# Patient Record
Sex: Male | Born: 1985 | Race: Black or African American | Hispanic: No | Marital: Married | Smoking: Current some day smoker
Health system: Southern US, Community
[De-identification: ages and names within clinical notes are randomized; demographics above are authoritative.]

## PROBLEM LIST (undated history)

## (undated) DIAGNOSIS — G4733 Obstructive sleep apnea (adult) (pediatric): Secondary | ICD-10-CM

## (undated) DIAGNOSIS — I10 Essential (primary) hypertension: Secondary | ICD-10-CM

## (undated) DIAGNOSIS — R7303 Prediabetes: Secondary | ICD-10-CM

## (undated) DIAGNOSIS — M545 Low back pain, unspecified: Secondary | ICD-10-CM

## (undated) DIAGNOSIS — R12 Heartburn: Secondary | ICD-10-CM

## (undated) HISTORY — DX: Low back pain, unspecified: M54.50

## (undated) HISTORY — DX: Heartburn: R12

## (undated) HISTORY — DX: Essential (primary) hypertension: I10

## (undated) HISTORY — DX: Prediabetes: R73.03

## (undated) HISTORY — DX: Morbid (severe) obesity due to excess calories: E66.01

## (undated) HISTORY — DX: Obstructive sleep apnea (adult) (pediatric): G47.33

## (undated) HISTORY — PX: HERNIA REPAIR: SHX51

---
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
## 2017-02-03 DIAGNOSIS — Z23 Encounter for immunization: Secondary | ICD-10-CM | POA: Diagnosis not present

## 2018-02-11 ENCOUNTER — Encounter (INDEPENDENT_AMBULATORY_CARE_PROVIDER_SITE_OTHER): Payer: BLUE CROSS/BLUE SHIELD

## 2018-02-22 ENCOUNTER — Encounter (INDEPENDENT_AMBULATORY_CARE_PROVIDER_SITE_OTHER): Payer: Self-pay

## 2018-02-22 ENCOUNTER — Ambulatory Visit (INDEPENDENT_AMBULATORY_CARE_PROVIDER_SITE_OTHER): Payer: Self-pay | Admitting: Family Medicine

## 2018-03-08 ENCOUNTER — Ambulatory Visit (INDEPENDENT_AMBULATORY_CARE_PROVIDER_SITE_OTHER): Payer: Self-pay | Admitting: Family Medicine

## 2018-06-29 ENCOUNTER — Encounter: Payer: Self-pay | Admitting: Family Medicine

## 2018-07-01 ENCOUNTER — Encounter: Payer: Self-pay | Admitting: Family Medicine

## 2018-07-01 ENCOUNTER — Other Ambulatory Visit: Payer: Self-pay

## 2018-07-01 ENCOUNTER — Ambulatory Visit (INDEPENDENT_AMBULATORY_CARE_PROVIDER_SITE_OTHER): Payer: BLUE CROSS/BLUE SHIELD | Admitting: Family Medicine

## 2018-07-01 ENCOUNTER — Ambulatory Visit (INDEPENDENT_AMBULATORY_CARE_PROVIDER_SITE_OTHER): Payer: BLUE CROSS/BLUE SHIELD

## 2018-07-01 VITALS — BP 120/92 | HR 108 | Temp 98.2°F | Resp 17 | Ht 70.0 in | Wt 367.6 lb

## 2018-07-01 DIAGNOSIS — R202 Paresthesia of skin: Secondary | ICD-10-CM | POA: Diagnosis not present

## 2018-07-01 DIAGNOSIS — E669 Obesity, unspecified: Secondary | ICD-10-CM | POA: Diagnosis not present

## 2018-07-01 DIAGNOSIS — Z6841 Body Mass Index (BMI) 40.0 and over, adult: Secondary | ICD-10-CM | POA: Diagnosis not present

## 2018-07-01 DIAGNOSIS — M5416 Radiculopathy, lumbar region: Secondary | ICD-10-CM

## 2018-07-01 DIAGNOSIS — M5432 Sciatica, left side: Secondary | ICD-10-CM

## 2018-07-01 DIAGNOSIS — M545 Low back pain: Secondary | ICD-10-CM | POA: Diagnosis not present

## 2018-07-01 DIAGNOSIS — M5430 Sciatica, unspecified side: Secondary | ICD-10-CM

## 2018-07-01 DIAGNOSIS — Z7689 Persons encountering health services in other specified circumstances: Secondary | ICD-10-CM

## 2018-07-01 MED ORDER — CYCLOBENZAPRINE HCL 5 MG PO TABS: 5.0000 mg | ORAL_TABLET | Freq: Two times a day (BID) | ORAL | 1 refills | Status: DC | PRN

## 2018-07-01 MED ORDER — PREDNISONE 20 MG PO TABS: ORAL_TABLET | ORAL | 0 refills | Status: DC

## 2018-07-01 NOTE — Patient Instructions (Addendum)
Thank you for choosing Primary Care at [facility] to be your medical home!    Jacob Roberts was seen by [name], FNP today.   Desmond Lope primary care provider is Bing Neighbors, FNP.   For the best care possible, you should try to see [name], FNP-C whenever you come to the clinic.   We look forward to seeing you again soon!  If you have any questions about your visit today, please call us at [phone] or feel free to reach your primary care provider via MyChart.    Acute Back Pain, Adult Acute back pain is sudden and usually short-lived. It is often caused by an injury to the muscles and tissues in the back. The injury may result from:  A muscle or ligament getting overstretched or torn (strained). Ligaments are tissues that connect bones to each other. Lifting something improperly can cause a back strain.  Wear and tear (degeneration) of the spinal disks. Spinal disks are circular tissue that provides cushioning between the bones of the spine (vertebrae).  Twisting motions, such as while playing sports or doing yard work.  A hit to the back.  Arthritis. You may have a physical exam, lab tests, and imaging tests to find the cause of your pain. Acute back pain usually goes away with rest and home care. Follow these instructions at home: Managing pain, stiffness, and swelling  Take over-the-counter and prescription medicines only as told by your health care provider.  Your health care provider may recommend applying ice during the first 24-48 hours after your pain starts. To do this: ? Put ice in a plastic bag. ? Place a towel between your skin and the bag. ? Leave the ice on for 20 minutes, 2-3 times a day.  If directed, apply heat to the affected area as often as told by your health care provider. Use the heat source that your health care provider recommends, such as a moist heat pack or a heating pad. ? Place a towel between your skin and the  heat source. ? Leave the heat on for 20-30 minutes. ? Remove the heat if your skin turns bright red. This is especially important if you are unable to feel pain, heat, or cold. You have a greater risk of getting burned. Activity   Do not stay in bed. Staying in bed for more than 1-2 days can delay your recovery.  Sit up and stand up straight. Avoid leaning forward when you sit, or hunching over when you stand. ? If you work at a desk, sit close to it so you do not need to lean over. Keep your chin tucked in. Keep your neck drawn back, and keep your elbows bent at a right angle. Your arms should look like the letter "L." ? Sit high and close to the steering wheel when you drive. Add lower back (lumbar) support to your car seat, if needed.  Take short walks on even surfaces as soon as you are able. Try to increase the length of time you walk each day.  Do not sit, drive, or stand in one place for more than 30 minutes at a time. Sitting or standing for long periods of time can put stress on your back.  Do not drive or use heavy machinery while taking prescription pain medicine.  Use proper lifting techniques. When you bend and lift, use positions that put less stress on your back: ? [place] your knees. ? Keep the load close to  your body. ? Avoid twisting.  Exercise regularly as told by your health care provider. Exercising helps your back heal faster and helps prevent back injuries by keeping muscles strong and flexible.  Work with a physical therapist to make a safe exercise program, as recommended by your health care provider. Do any exercises as told by your physical therapist. Lifestyle  Maintain a healthy weight. Extra weight puts stress on your back and makes it difficult to have good posture.  Avoid activities or situations that make you feel anxious or stressed. Stress and anxiety increase muscle tension and can make back pain worse. Learn ways to manage anxiety and stress, such as  through exercise. General instructions  Sleep on a firm mattress in a comfortable position. Try lying on your side with your knees slightly bent. If you lie on your back, put a pillow under your knees.  Follow your treatment plan as told by your health care provider. This may include: ? Cognitive or behavioral therapy. ? Acupuncture or massage therapy. ? Meditation or yoga. Contact a health care provider if:  You have pain that is not relieved with rest or medicine.  You have increasing pain going down into your legs or buttocks.  Your pain does not improve after 2 weeks.  You have pain at night.  You lose weight without trying.  You have a fever or chills. Get help right away if:  You develop new bowel or bladder control problems.  You have unusual weakness or numbness in your arms or legs.  You develop nausea or vomiting.  You develop abdominal pain.  You feel faint. Summary  Acute back pain is sudden and usually short-lived.  Use proper lifting techniques. When you bend and lift, use positions that put less stress on your back.  Take over-the-counter and prescription medicines and apply heat or ice as directed by your health care provider. This information is not intended to replace advice given to you by your health care provider. Make sure you discuss any questions you have with your health care provider. Document Released: 04/07/2005 Document Revised: 11/12/2017 Document Reviewed: 11/19/2016 Elsevier Interactive Patient Education  2019 [employer] Inc[employer]    Radicular Pain Radicular pain is a type of pain that spreads from your back or neck along a spinal nerve. Spinal nerves are nerves that leave the spinal cord and go to the muscles. Radicular pain is sometimes called radiculopathy, radiculitis, or a pinched nerve. When you have this type of pain, you may also have weakness, numbness, or tingling in the area of your body that is supplied by the nerve. The pain may  feel sharp and burning. Depending on which spinal nerve is affected, the pain may occur in the:  Neck area (cervical radicular pain). You may also feel pain, numbness, weakness, or tingling in the arms.  Mid-spine area (thoracic radicular pain). You would feel this pain in the back and chest. This type is rare.  Lower back area (lumbar radicular pain). You would feel this pain as low back pain. You may feel pain, numbness, weakness, or tingling in the buttocks or legs. Sciatica is a type of lumbar radicular pain that shoots down the back of the leg. Radicular pain occurs when one of the spinal nerves becomes irritated or squeezed (compressed). It is often caused by something pushing on a spinal nerve, such as one of the bones of the spine (vertebrae) or one of the round cushions between vertebrae (intervertebral disks). This can result from:  An injury.  Wear and tear or aging of a disk.  The growth of a bone spur that pushes on the nerve. Radicular pain often goes away when you follow instructions from your health care provider for relieving pain at home. Follow these instructions at home: Managing pain      If directed, put ice on the affected area: ? Put ice in a plastic bag. ? Place a towel between your skin and the bag. ? Leave the ice on for 20 minutes, 2-3 times a day.  If directed, apply heat to the affected area as often as told by your health care provider. Use the heat source that your health care provider recommends, such as a moist heat pack or a heating pad. ? Place a towel between your skin and the heat source. ? Leave the heat on for 20-30 minutes. ? Remove the heat if your skin turns bright red. This is especially important if you are unable to feel pain, heat, or cold. You may have a greater risk of getting burned. Activity   Do not sit or rest in bed for long periods of time.  Try to stay as active as possible. Ask your health care provider what type of exercise  or activity is best for you.  Avoid activities that make your pain worse, such as bending and lifting.  Do not lift anything that is heavier than 10 lb (4.5 kg), or the limit that you are told, until your health care provider says that it is safe.  Practice using proper technique when lifting items. Proper lifting technique involves bending your knees and rising up.  Do strength and range-of-motion exercises only as told by your health care provider or physical therapist. General instructions  Take over-the-counter and prescription medicines only as told by your health care provider.  Pay attention to any changes in your symptoms.  Keep all follow-up visits as told by your health care provider. This is important. ? Your health care provider may send you to a physical therapist to help with this pain. Contact a health care provider if:  Your pain and other symptoms get worse.  Your pain medicine is not helping.  Your pain has not improved after a few weeks of home care.  You have a fever. Get help right away if:  You have severe pain, weakness, or numbness.  You have difficulty with bladder or bowel control. Summary  Radicular pain is a type of pain that spreads from your back or neck along a spinal nerve.  When you have radicular pain, you may also have weakness, numbness, or tingling in the area of your body that is supplied by the nerve.  The pain may feel sharp or burning.  Radicular pain may be treated with ice, heat, medicines, or physical therapy. This information is not intended to replace advice given to you by your health care provider. Make sure you discuss any questions you have with your health care provider. Document Released: 05/15/2004 Document Revised: 10/20/2017 Document Reviewed: 10/20/2017 Elsevier Interactive Patient Education  2019 [employer].

## 2018-07-01 NOTE — Progress Notes (Signed)
Jacob Roberts, is a 33 y.o. male  VVZ:[id number]  [id number]:[id number]  DOB - 08/24/1985  CC:  Chief Complaint  Patient presents with  . Establish Care  . Back Pain    hurt his back shoveling snow 2 years ago. has c/o lower back pain that radiates down L leg       HPI: Jacob Roberts is a 33 y.o. male is here today to establish care and evaluation of back pain.  Back Pain Patient presents with a complaint of back pain which is radiating into his buttocks down to his level of his thighs. He reports worsening of leg pain on the left side.  Initially began 2 years ago as he feels that he injured his back shoveling snow.  He has been managing pain with over-the-counter NSAIDs he has not had previous imaging or evaluation for this problem.  He denies any problems with urination, defecation, or numbness or tingling of lower extremities. Current Body mass index is 52.75 kg/m.  Pertinent family medical history: family history includes Diabetes in his brother; Hypertension in his father, maternal grandmother, and mother.   Allergies  Allergen Reactions  . Penicillins Hives  . Sulfa Antibiotics Shortness Of Breath    Social History   Socioeconomic History  . Marital status: Married    Spouse name: Not on file  . Number of children: 2  . Years of education: Not on file  . Highest education level: Not on file  Occupational History  . Not on file  Social Needs  . Financial resource strain: Not on file  . Food insecurity:    Worry: Not on file    Inability: Not on file  . Transportation needs:    Medical: Not on file    Non-medical: Not on file  Tobacco Use  . Smoking status: Current Every Day Smoker  . Smokeless tobacco: Never Used  Substance and Sexual Activity  . Alcohol use: Yes  . Drug use: Not on file  . Sexual activity: Yes    Partners: Female  Lifestyle  . Physical activity:    Days per week: Not on file    Minutes per session: Not on file  . Stress: Not on file   Relationships  . Social connections:    Talks on phone: Not on file    Gets together: Not on file    Attends religious service: Not on file    Active member of club or organization: Not on file    Attends meetings of clubs or organizations: Not on file    Relationship status: Not on file  . Intimate partner violence:    Fear of current or ex partner: Not on file    Emotionally abused: Not on file    Physically abused: Not on file    Forced sexual activity: Not on file  Other Topics Concern  . Not on file  Social History Narrative  . Not on file   Review of Systems: Pertinent negatives listed in HPI Objective:   Vitals:   07/01/18 1517  BP: (!) 120/92  Pulse: (!) 108  Resp: 17  Temp: 98.2 F (36.8 C)  SpO2: 95%    BP Readings from Last 3 Encounters:  07/01/18 (!) 120/92    Filed Weights   07/01/18 1517  Weight: (!) 367 lb 9.6 oz (166.7 kg)      Physical Exam: Constitutional: Patient appears well-developed and well-nourished. No distress. HENT: Normocephalic, atraumatic, External right and left ear normal.  Eyes: Conjunctivae  and EOM are normal. PERRLA, no scleral icterus. Neck: Normal ROM. Neck supple. No JVD. No tracheal deviation. No thyromegaly. CVS: RRR, S1/S2 +, no murmurs, no gallops, no carotid bruit.  Pulmonary: Effort and breath sounds normal, no stridor, rhonchi, wheezes, rales.  Abdominal: Soft. BS +, no distension, tenderness, rebound or guarding.  Musculoskeletal: Positive bilateral SLR  Neuro: Alert. Normal muscle tone coordination. Normal gait. BUE and BLE strength 5/5.  Skin: Skin is warm and dry. No rash noted. Not diaphoretic. No erythema. No pallor. Psychiatric: Normal mood and affect. Behavior, judgment, thought content normal.     Assessment and plan:  1. Encounter to establish care 2. Sciatica, unspecified laterality 3. Lumbar radiculopathy Imaging performed was negative of any chronic or acute changes. Will trial prednisone taper and  cyclobenzaprine twice daily as needed for pain. If no improvement, will refer to orthopedic speciality for further evaluation.  Dg Lumbar Spine Complete Result Date: 07/01/2018 CLINICAL DATA:  Chronic l spine pain x 2 years. Pain centered around left aspect oaf spine around L4-L5, with persistent dull pain. Depending on how he lays left leg can go numb. Thinks from shoveling snow in 2018. EXAM: LUMBAR SPINE - COMPLETE 4+ VIEW COMPARISON:  None. FINDINGS: Transitional thoracolumbar vertebra designated T12. No fracture, bone lesion or spondylolisthesis. Discs are well maintained in height. Facet joints are well maintained. Soft tissues are unremarkable. IMPRESSION: Negative. Electronically Signed   By: Amie Portland M.D.   On: 07/01/2018 15:54   Meds ordered this encounter  Medications  . predniSONE (DELTASONE) 20 MG tablet    Sig: Take 3 PO QAM x3days, 2 PO QAM x3days, 1 PO QAM x3days    Dispense:  18 tablet    Refill:  0  . cyclobenzaprine (FLEXERIL) 5 MG tablet    Sig: Take 1 tablet (5 mg total) by mouth 2 (two) times daily as needed for muscle spasms.    Dispense:  30 tablet    Refill:  1     Return in about 4 weeks (around 07/29/2018).   A total of 30 minutes spent, greater than 50 % of this time was spent counseling, reviewing and interpreting diagnostic study,  and coordination of care.   The patient was given clear instructions to go to ER or return to medical center if symptoms don't improve, worsen or new problems develop. The patient verbalized understanding. The patient was advised  to call and obtain lab results if they haven't heard anything from out office within 7-10 business days.  [name], FNP Primary Care at [facility] [address] [place] [address] [phone]: [phone]      This note has been created with Dragon speech recognition software and Paediatric nurse. Any transcriptional errors are unintentional.

## 2018-07-04 DIAGNOSIS — M5416 Radiculopathy, lumbar region: Secondary | ICD-10-CM | POA: Insufficient documentation

## 2018-07-04 DIAGNOSIS — Z7689 Persons encountering health services in other specified circumstances: Secondary | ICD-10-CM | POA: Insufficient documentation

## 2018-08-03 ENCOUNTER — Telehealth: Payer: Self-pay

## 2018-08-03 NOTE — Telephone Encounter (Signed)
Called patient to do their pre-visit COVID screening.  Call went to voicemail. Unable to do screening.  

## 2018-08-04 ENCOUNTER — Other Ambulatory Visit: Payer: Self-pay

## 2018-08-04 ENCOUNTER — Encounter: Payer: Self-pay | Admitting: Family Medicine

## 2018-08-04 ENCOUNTER — Ambulatory Visit (INDEPENDENT_AMBULATORY_CARE_PROVIDER_SITE_OTHER): Payer: BLUE CROSS/BLUE SHIELD | Admitting: Family Medicine

## 2018-08-04 VITALS — BP 148/90 | HR 89 | Temp 98.0°F | Ht 70.0 in | Wt 377.6 lb

## 2018-08-04 DIAGNOSIS — M5431 Sciatica, right side: Secondary | ICD-10-CM | POA: Diagnosis not present

## 2018-08-04 DIAGNOSIS — E669 Obesity, unspecified: Secondary | ICD-10-CM | POA: Diagnosis not present

## 2018-08-04 DIAGNOSIS — M5432 Sciatica, left side: Secondary | ICD-10-CM | POA: Diagnosis not present

## 2018-08-04 DIAGNOSIS — Z6841 Body Mass Index (BMI) 40.0 and over, adult: Secondary | ICD-10-CM

## 2018-08-04 DIAGNOSIS — I10 Essential (primary) hypertension: Secondary | ICD-10-CM

## 2018-08-04 MED ORDER — KETOROLAC TROMETHAMINE 60 MG/2ML IM SOLN
60.0000 mg | Freq: Once | INTRAMUSCULAR | Status: AC
  Administered 2018-08-04: 60 mg via INTRAMUSCULAR

## 2018-08-04 MED ORDER — MELOXICAM 15 MG PO TABS: 15.0000 mg | ORAL_TABLET | Freq: Every day | ORAL | 2 refills | Status: DC

## 2018-08-04 NOTE — Progress Notes (Signed)
Patient ID: Jacob Roberts, male    DOB: 06/10/1985, 33 y.o.   MRN: [id number]  PCP: Bing Neighbors, FNP  Chief Complaint  Patient presents with  . Back Pain    Subjective:  HPI  Jacob Roberts is a 33 y.o. male presents for evaluation of chronic on-going back pain.  Medical problems include: Obesity, hypertension, and chronic back pain.   Jacob Roberts was seen in office on 07/01/18 for evaluation and management of on-going back pain with right sided sciatica. Prior to his visit, patient was taking multiple doses of ibuprofen and naproxen to manage pain.Prior to visit on 3/12 he had no prior imaging. Imaging performed on 3/12 was negative for degenerative or acute changes to explain his symptoms. He was placed on a prednisone taper and report immediate relief while taking prednisone. Pain returned immediately after completing taper. He has resumed daily naproxen for pain management and continues to take cyclobenzaprine at night to control pain. Pain continues to be situated to mid to lower right back and radiates down to right buttocks-hip area. He has started walking for activity however this worsens pain. He has recently joined a medical weight management program and starts program within one week. Current Body mass index is 54.18 kg/m. Social History   Socioeconomic History  . Marital status: Married    Spouse name: Not on file  . Number of children: 2  . Years of education: Not on file  . Highest education level: Not on file  Occupational History  . Not on file  Social Needs  . Financial resource strain: Not on file  . Food insecurity:    Worry: Not on file    Inability: Not on file  . Transportation needs:    Medical: Not on file    Non-medical: Not on file  Tobacco Use  . Smoking status: Current Every Day Smoker  . Smokeless tobacco: Never Used  Substance and Sexual Activity  . Alcohol use: Yes  . Drug use: Never  . Sexual activity: Yes    Partners: Female   Lifestyle  . Physical activity:    Days per week: Not on file    Minutes per session: Not on file  . Stress: Not on file  Relationships  . Social connections:    Talks on phone: Not on file    Gets together: Not on file    Attends religious service: Not on file    Active member of club or organization: Not on file    Attends meetings of clubs or organizations: Not on file    Relationship status: Not on file  . Intimate partner violence:    Fear of current or ex partner: Not on file    Emotionally abused: Not on file    Physically abused: Not on file    Forced sexual activity: Not on file  Other Topics Concern  . Not on file  Social History Narrative  . Not on file    Family History  Problem Relation Age of Onset  . Hypertension Maternal Grandmother   . Hypertension Mother   . Hypertension Father   . Diabetes Brother   . Stroke Neg Hx   . Hyperlipidemia Neg Hx   . Heart disease Neg Hx    Review of Systems  Pertinent negatives listed in HPI Patient Active Problem List   Diagnosis Date Noted  . Lumbar radiculopathy 07/04/2018  . Encounter to establish care 07/04/2018    Allergies  Allergen Reactions  . Penicillins  Hives  . Sulfa Antibiotics Shortness Of Breath    Prior to Admission medications   Medication Sig Start Date End Date Taking? Authorizing Provider  cyclobenzaprine (FLEXERIL) 5 MG tablet Take 1 tablet (5 mg total) by mouth 2 (two) times daily as needed for muscle spasms. 07/01/18  Yes Bing NeighborsHarris, Shandrea Lusk S, FNP  naproxen sodium (ALEVE) 220 MG tablet Take 220 mg by mouth daily.   Yes [provider]  meloxicam (MOBIC) 15 MG tablet Take 1 tablet (15 mg total) by mouth daily. 08/04/18   Bing NeighborsHarris, Keshia Weare S, FNP    Past Medical, Surgical Family and Social History reviewed and updated.    Objective:   Today's Vitals   08/04/18 1402 08/04/18 1406  BP: (!) 142/93 (!) 148/90  Pulse: 86 89  Temp: 98 F (36.7 C)   TempSrc: Oral   SpO2: 98%    Weight: (!) 377 lb 9.6 oz (171.3 kg)   Height: 5\' 10"  (1.778 m)   PainSc: 6  6   PainLoc: Back     Wt Readings from Last 3 Encounters:  08/04/18 (!) 377 lb 9.6 oz (171.3 kg)  07/01/18 (!) 367 lb 9.6 oz (166.7 kg)     Physical Exam General appearance: alert, well developed, well nourished, cooperative and in no distress Head: Normocephalic, without obvious abnormality, atraumatic Respiratory: Respirations even and unlabored, normal respiratory rate Heart: rate and rhythm normal. No gallop or murmurs noted on exam  Extremities: No gross deformities Skin: Skin color, texture, turgor normal. No rashes seen  Psych: Appropriate mood and affect. Neurologic: Mental status: Alert, oriented to person, place, and time, thought content appropriate.   Assessment & Plan:  1. Bilateral sciatica Given recent prednisone taper less than 1 month ago, I will defer prednisone for treatment. He agreed to receive a ketorolac injection today while in office.  Start Meloxicam as needed for antiinflammatory and pain management. Referral placed to Orthopedic Surgery  2. Essential hypertension -Advised patient BP elevated today. Hx of hypertension, however improved, medication was discontinued. Encouraged patient to monitor BP at home, recommend DASH diet, and increase physical activity    A total of 25 minutes spent, greater than 50 % of this time was spent counseling and coordination of care.  -The patient was given clear instructions to go to ER or return to medical center if symptoms do not improve, worsen or new problems develop. The patient verbalized understanding.    Jacob CourtsKimberly Stephone Gum, FNP [facility] [address], [place] [id number] [phone]: [phone]

## 2018-08-04 NOTE — Progress Notes (Deleted)
Back pain, when he stopped his steroid medication the pain came back. But when he was taking it, the pain was manageable.

## 2018-08-04 NOTE — Patient Instructions (Signed)

## 2018-08-13 ENCOUNTER — Ambulatory Visit (INDEPENDENT_AMBULATORY_CARE_PROVIDER_SITE_OTHER): Payer: BLUE CROSS/BLUE SHIELD | Admitting: Orthopaedic Surgery

## 2018-08-13 ENCOUNTER — Other Ambulatory Visit: Payer: Self-pay

## 2018-08-13 ENCOUNTER — Encounter (INDEPENDENT_AMBULATORY_CARE_PROVIDER_SITE_OTHER): Payer: Self-pay | Admitting: Orthopaedic Surgery

## 2018-08-13 VITALS — Ht 70.0 in | Wt 375.0 lb

## 2018-08-13 DIAGNOSIS — Z6841 Body Mass Index (BMI) 40.0 and over, adult: Secondary | ICD-10-CM

## 2018-08-13 DIAGNOSIS — M545 Low back pain, unspecified: Secondary | ICD-10-CM

## 2018-08-13 DIAGNOSIS — G8929 Other chronic pain: Secondary | ICD-10-CM

## 2018-08-13 NOTE — Progress Notes (Signed)
Office Visit Note/orthopedic consultation   Patient: Jacob Roberts           Date of Birth: 22-Jun-1985           MRN: [id number] Visit Date: 08/13/2018              Requested by: Bing NeighborsHarris, Kimberly S, FNP [address] [place], [place] [address] PCP: Bing NeighborsHarris, Kimberly S, FNP   Assessment & Plan: Visit Diagnoses:  1. Chronic left-sided low back pain, unspecified whether sciatica present   2. Body mass index 50.0-59.9, adult (HCC)     Plan: The patient meets the AMA guidelines for Morbid (severe) obesity with a BMI > 40.0 and I have recommended weight loss. We will proceed with a lumbar MRI will have to use an open scanner due to his body habitus we discussed with him that if there was anything surgically indicated his BMI would need to be less than 40 which would put his target weight at about 275 and has about 100 pounds he needs to lose.  He may have something that could respond to an epidural injection.  He will continue with the medical weight loss management program we discussed using exercise bike other activities that should not bother his back.  Office follow-up after MRI scan for review.  Thanks the opportunity see him in consultation.  Follow-Up Instructions: Return after MRI.  Weigh patient on return.  Orders:  Orders Placed This Encounter  Procedures   MR Lumbar Spine w/o contrast   No orders of the defined types were placed in this encounter.     Procedures: No procedures performed   Clinical Data: No additional findings.   Subjective: Chief Complaint  Patient presents with   Lower Back - Pain    HPI patient was sent by Joaquin CourtsKimberly Harris, FNP for consultation.  Patient is a 33 year old male with chronic low back pain which is been present for 2 years since he was shoveling the driveway.  Has pain at the lumbosacral junction he was given prednisone pack got good relief temporarily and then had recurrence of pain.  He took some Aleve but states the  meloxicam he is currently on is worked better he is used some Flexeril but when he takes it during the day makes him a little bit sleepy.  He has pain that radiates more on the left lower back and left posterior buttocks left thigh with numbness in his thigh does not radiate past his knee.  Patient is continued to work.  Denies bowel bladder symptoms no fever chills.  No history of rheumatologic disease.  BMI elevated at 53.  Few weeks ago he was having more pain on the right than left side again in his buttocks radiating to his thigh.  Walking worsens his pain.  Recent joining a medical weight loss management program andHe is lost a few pounds.  Review of Systems positive for morbid obesity chronic low back pain.   Objective: Vital Signs: Ht 5\' 10"  (1.778 m)    Wt (!) 375 lb (170.1 kg)    BMI 53.81 kg/m   Physical Exam Constitutional:      Appearance: He is well-developed.  HENT:     Head: Normocephalic and atraumatic.  Eyes:     Pupils: Pupils are equal, round, and reactive to light.  Neck:     Thyroid: No thyromegaly.     Trachea: No tracheal deviation.  Cardiovascular:     Rate and Rhythm: Normal rate.  Pulmonary:     Effort: Pulmonary effort is normal.     Breath sounds: No wheezing.  Abdominal:     General: Bowel sounds are normal.     Palpations: Abdomen is soft.  Skin:    General: Skin is warm and dry.     Capillary Refill: Capillary refill takes less than 2 seconds.  Neurological:     Mental Status: He is alert and oriented to person, place, and time.  Psychiatric:        Behavior: Behavior normal.        Thought Content: Thought content normal.        Judgment: Judgment normal.     Ortho Exam patient is able heel and toe walk knee and ankle jerk are intact negative logroll of the hips.  Mild discomfort straight leg raising right and left at 90 degrees.  Mild sciatic notch tenderness some pain with palpation lumbosacral junction.  No rash over exposed skin.  Anterior  tib gastrocsoleus is strong he can easily heel and toe walk.  No atrophy to either right or left lower extremity.normal cervical ROM. Specialty Comments:  No specialty comments available.  Imaging: CLINICAL DATA:  Chronic l spine pain x 2 years. Pain centered around left aspect oaf spine around L4-L5, with persistent dull pain. Depending on how he lays left leg can go numb. Thinks from shoveling snow in 2018.  EXAM: LUMBAR SPINE - COMPLETE 4+ VIEW  COMPARISON:  None.  FINDINGS: Transitional thoracolumbar vertebra designated T12.  No fracture, bone lesion or spondylolisthesis.  Discs are well maintained in height. Facet joints are well maintained.  Soft tissues are unremarkable.  IMPRESSION: Negative.   Electronically Signed   By: Amie Portland M.D.   On: 07/01/2018 15:54   Images and results reviewed with pt today.  PMFS History: Patient Active Problem List   Diagnosis Date Noted   Lumbar radiculopathy 07/04/2018   Encounter to establish care 07/04/2018   Past Medical History:  Diagnosis Date   Essential hypertension    Morbid obesity (HCC)    Obstructive sleep apnea     Family History  Problem Relation Age of Onset   Hypertension Maternal Grandmother    Hypertension Mother    Hypertension Father    Diabetes Brother    Stroke Neg Hx    Hyperlipidemia Neg Hx    Heart disease Neg Hx     Past Surgical History:  Procedure Laterality Date   HERNIA REPAIR     Social History   Occupational History   Not on file  Tobacco Use   Smoking status: Current Every Day Smoker   Smokeless tobacco: Never Used  Substance and Sexual Activity   Alcohol use: Yes   Drug use: Never   Sexual activity: Yes    Partners: Female

## 2018-08-15 ENCOUNTER — Encounter (INDEPENDENT_AMBULATORY_CARE_PROVIDER_SITE_OTHER): Payer: Self-pay | Admitting: Orthopaedic Surgery

## 2018-08-15 DIAGNOSIS — Z6841 Body Mass Index (BMI) 40.0 and over, adult: Secondary | ICD-10-CM | POA: Insufficient documentation

## 2018-08-16 ENCOUNTER — Other Ambulatory Visit: Payer: Self-pay | Admitting: Family Medicine

## 2018-08-30 ENCOUNTER — Ambulatory Visit
Admission: RE | Admit: 2018-08-30 | Discharge: 2018-08-30 | Disposition: A | Discharge status: Home / Self Care | Payer: BLUE CROSS/BLUE SHIELD | Source: Ambulatory Visit | Attending: Orthopaedic Surgery | Admitting: Orthopaedic Surgery

## 2018-08-30 ENCOUNTER — Other Ambulatory Visit: Payer: Self-pay

## 2018-08-30 DIAGNOSIS — M48061 Spinal stenosis, lumbar region without neurogenic claudication: Secondary | ICD-10-CM | POA: Diagnosis not present

## 2018-08-30 DIAGNOSIS — M545 Low back pain, unspecified: Secondary | ICD-10-CM

## 2018-08-30 DIAGNOSIS — G8929 Other chronic pain: Secondary | ICD-10-CM

## 2018-09-03 ENCOUNTER — Ambulatory Visit: Payer: BLUE CROSS/BLUE SHIELD | Admitting: Orthopaedic Surgery

## 2018-09-03 ENCOUNTER — Encounter: Payer: Self-pay | Admitting: Orthopaedic Surgery

## 2018-09-03 ENCOUNTER — Other Ambulatory Visit: Payer: Self-pay

## 2018-09-03 VITALS — Ht 70.0 in | Wt 375.0 lb

## 2018-09-03 DIAGNOSIS — Z6841 Body Mass Index (BMI) 40.0 and over, adult: Secondary | ICD-10-CM | POA: Diagnosis not present

## 2018-09-03 DIAGNOSIS — M5416 Radiculopathy, lumbar region: Secondary | ICD-10-CM | POA: Diagnosis not present

## 2018-09-03 DIAGNOSIS — M47816 Spondylosis without myelopathy or radiculopathy, lumbar region: Secondary | ICD-10-CM | POA: Diagnosis not present

## 2018-09-03 NOTE — Progress Notes (Signed)
Office Visit Note   Patient: Jacob Roberts           Date of Birth: 12-Oct-1985           MRN: [id number] Visit Date: 09/03/2018              Requested by: Bing NeighborsHarris, Kimberly S, FNP [address] [place], [place] [address] PCP: Bing NeighborsHarris, Kimberly S, FNP   Assessment & Plan: Visit Diagnoses:  1. Lumbar radiculopathy   2. Body mass index 50.0-59.9, adult (HCC)     Plan: His MRI scan shows some facet arthropathy moderate degree at L4-5 on the left.  We will set him up for weight loss clinic with Dr. Dalbert GarnetBeasley patient's request his wife is already a patient there and is happy with her progress.  We will send him for some physical therapy.  Recheck 1 month.  Therapy is not helpful we can consider a single L4-5 facet injection although he has gotten some improvement with the meloxicam.We discussed improvement in his symptoms with weight loss which will help unload the facets.  He does not have any significant compression requiring operative intervention at this time.  Follow-Up Instructions: No follow-ups on file.   Orders:  No orders of the defined types were placed in this encounter.  No orders of the defined types were placed in this encounter.     Procedures: No procedures performed   Clinical Data: No additional findings.   Subjective: Chief Complaint  Patient presents with  . Lower Back - Pain    MRI Lumbar Review    HPI 33 year old male returns with ongoing problems with chronic low back pain.  Onset a few years ago when he shelving the driveway.  He has been treated conservatively prednisone, anti-inflammatories, meloxicam, Flexeril.  MRI scan has been obtained and is available for review.  Has primarily left side symptoms.  No bowel bladder associated symptoms.  BMI is greater than 50 he is been working on weight loss has not been very successful during coronavirus quarantine with limited mobility.  Patient has noted some improvement with meloxicam.  Review of  Systems update unchanged from 08/12/2012 point systems updated.   Objective: Vital Signs: Ht 5\' 10"  (1.778 m)   Wt (!) 375 lb (170.1 kg)   BMI 53.81 kg/m   Physical Exam Constitutional:      Appearance: He is well-developed.  HENT:     Head: Normocephalic and atraumatic.  Eyes:     Pupils: Pupils are equal, round, and reactive to light.  Neck:     Thyroid: No thyromegaly.     Trachea: No tracheal deviation.  Cardiovascular:     Rate and Rhythm: Normal rate.  Pulmonary:     Effort: Pulmonary effort is normal.     Breath sounds: No wheezing.  Abdominal:     General: Bowel sounds are normal.     Palpations: Abdomen is soft.  Skin:    General: Skin is warm and dry.     Capillary Refill: Capillary refill takes less than 2 seconds.  Neurological:     Mental Status: He is alert and oriented to person, place, and time.  Psychiatric:        Behavior: Behavior normal.        Thought Content: Thought content normal.        Judgment: Judgment normal.     Ortho Exam patient has some discomfort with bending turning twisting negative logroll of the hips.  No anterior tib  weakness.  Specialty Comments:  No specialty comments available.  Imaging: CLINICAL DATA:  Chronic low back pain radiating into the left buttock and left leg with leg numbness.  EXAM: MRI LUMBAR SPINE WITHOUT CONTRAST  TECHNIQUE: Multiplanar, multisequence MR imaging of the lumbar spine was performed. No intravenous contrast was administered.  COMPARISON:  Lumbar spine radiographs 07/01/2018  FINDINGS: Segmentation: The lowest fully formed intervertebral disc space is designated L5-S1.  Alignment:  Normal.  Vertebrae: No fracture or suspicious osseous lesion. Mild periarticular marrow and soft tissue edema associated with the left L4-5 facet joint.  Conus medullaris and cauda equina: Conus extends to the L1 level. Conus and cauda equina appear normal.  Paraspinal and other soft tissues:  Unremarkable.  Disc levels:  L1-2: Negative.  L2-3 and L3-4: Minimal disc bulging without stenosis.  L4-5: Mild disc desiccation with preserved disc space height. Minimal disc bulging, a small left foraminal disc osteophyte complex, and mild facet hypertrophy result in mild left neural foraminal stenosis without spinal stenosis.  L5-S1: Minimal disc bulging without stenosis.  IMPRESSION: 1. Mild lumbar spondylosis with mild left neural foraminal stenosis at L4-5. No spinal stenosis. 2. 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   Electronically Signed   By: Sebastian Ache M.D.   On: 08/30/2018 19:16    PMFS History: Patient Active Problem List   Diagnosis Date Noted  . Body mass index 50.0-59.9, adult (HCC) 08/15/2018  . Lumbar radiculopathy 07/04/2018  . Encounter to establish care 07/04/2018   Past Medical History:  Diagnosis Date  . Essential hypertension   . Morbid obesity (HCC)   . Obstructive sleep apnea     Family History  Problem Relation Age of Onset  . Hypertension Maternal Grandmother   . Hypertension Mother   . Hypertension Father   . Diabetes Brother   . Stroke Neg Hx   . Hyperlipidemia Neg Hx   . Heart disease Neg Hx     Past Surgical History:  Procedure Laterality Date  . HERNIA REPAIR     Social History   Occupational History  . Not on file  Tobacco Use  . Smoking status: Current Every Day Smoker  . Smokeless tobacco: Never Used  Substance and Sexual Activity  . Alcohol use: Yes  . Drug use: Never  . Sexual activity: Yes    Partners: Female

## 2018-10-01 ENCOUNTER — Other Ambulatory Visit: Payer: Self-pay | Admitting: Family Medicine

## 2018-10-01 NOTE — Telephone Encounter (Signed)
Please advise 

## 2018-10-07 ENCOUNTER — Other Ambulatory Visit: Payer: Self-pay

## 2018-10-07 ENCOUNTER — Ambulatory Visit: Payer: BC Managed Care – PPO | Attending: Orthopaedic Surgery

## 2018-10-07 DIAGNOSIS — R293 Abnormal posture: Secondary | ICD-10-CM | POA: Diagnosis not present

## 2018-10-07 DIAGNOSIS — M62830 Muscle spasm of back: Secondary | ICD-10-CM

## 2018-10-07 DIAGNOSIS — M5416 Radiculopathy, lumbar region: Secondary | ICD-10-CM

## 2018-10-07 NOTE — Therapy (Addendum)
[place], [place], [address] Phone: [phone]   Fax:  [phone]  Physical Therapy Evaluation/Discharge  Patient Details  Name: Orion Mole MRN: [id number] Date of Birth: 1985-07-25 Referring Provider (PT): Rodell Perna, MD   Encounter Date: 10/07/2018  PT End of Session - 10/07/18 1430    Visit Number  1    Number of Visits  10    Date for PT Re-Evaluation  11/12/18    Authorization Type  [employer]    PT Start Time  0130    PT Stop Time  0215    PT Time Calculation (min)  45 min    Activity Tolerance  Patient tolerated treatment well    Behavior During Therapy  [facility] for tasks assessed/performed       Past Medical History:  Diagnosis Date  . Essential hypertension   . Morbid obesity ([place])   . Obstructive sleep apnea     Past Surgical History:  Procedure Laterality Date  . HERNIA REPAIR      There were no vitals filed for this visit.   Subjective Assessment - 10/07/18 1340    Subjective  He reports onset of back pain with shoveling snow(2018).  Next day LBP . Worsened over next year.   MD sciatic nerve pain.   ORtho MD MRI need to lose weight.  Continues to work and do yard work.    How long can you walk comfortably?  1-2 miles    Diagnostic tests  MRI>  Facet OA L4-5, disc bulge.    Patient Stated Goals  decr pain    Currently in Pain?  Yes    Pain Score  4     Pain Location  Back    Pain Orientation  Left;Lower    Pain Descriptors / Indicators  Sore    Pain Type  Chronic pain    Pain Radiating Towards  LT buttock and posterior thigh    Pain Onset  More than a month ago    Pain Frequency  Constant    Aggravating Factors   lying , bending,    Pain Relieving Factors  meds ,  stretch         [facility] PT Assessment - 10/07/18 0001      Assessment   Medical Diagnosis  lumbar radiculopathy    Referring Provider (PT)  Rodell Perna, MD    Onset Date/Surgical Date  --   2018   Next MD  Visit  As needed    Prior Therapy  No      Precautions   Precautions  None      Restrictions   Weight Bearing Restrictions  No      Balance Screen   Has the patient fallen in the past 6 months  No      Prior Function   Level of Independence  Independent      Cognition   Overall Cognitive Status  Within Functional Limits for tasks assessed      Observation/Other Assessments   Focus on Therapeutic Outcomes (FOTO)   50% limited      Posture/Postural Control   Posture Comments  LT scapula more posterior  , mild sway back      ROM / Strength   AROM / PROM / Strength  AROM;PROM;Strength      AROM   AROM Assessment Site  Lumbar    Lumbar Flexion  45    Lumbar Extension  15  Lumbar - Right Side Bend  22    Lumbar - Left Side Bend  12      Strength   Overall Strength Comments  LE WNL      Palpation   Palpation comment  Tender LT lower lumbar soft tissues      Ambulation/Gait   Gait Comments  WNL                Objective measurements completed on examination: See above findings.                   PT Long Term Goals - 10/07/18 1440      PT LONG TERM GOAL #1   Title  He will be independent    Time  5    Period  Weeks    Status  New      PT LONG TERM GOAL #2   Title  He will report no thigh pain    Time  5    Period  Weeks    Status  New      PT LONG TERM GOAL #3   Title  He will report pain as intermittant and be able to work/walk as needed with 1-2 max pain    Time  5    Period  Weeks    Status  New             Plan - 10/07/18 1432    Clinical Impression Statement  MR Sharrar presents with LT lower back pain chronic with intermittant pain into posterior Lt thigh. He demo lumbar lordosis, incr pain with flexion and decrease with extension. We will start with extension. He should do better with skilled PT    Personal Factors and Comorbidities  Time since onset of injury/illness/exacerbation    Examination-Activity Limitations   Bend    Stability/Clinical Decision Making  Stable/Uncomplicated    Clinical Decision Making  Low    Rehab Potential  Good    PT Frequency  2x / week    PT Duration  --   5 weeks   PT Treatment/Interventions  Dry needling;Passive range of motion;Therapeutic exercise;Patient/family education;Manual techniques;Taping;Moist Heat;Iontophoresis 65m/ml Dexamethasone;Ultrasound;Electrical Stimulation    PT Next Visit Plan  review HEP,  Manual and modalities  core strength    PT Home Exercise Plan  press ups,    Consulted and Agree with Plan of Care  Patient       Patient will benefit from skilled therapeutic intervention in order to improve the following deficits and impairments:  Obesity, Pain, Postural dysfunction, Decreased activity tolerance, Increased muscle spasms  Visit Diagnosis: 1. Radiculopathy, lumbar region   2. Muscle spasm of back   3. Abnormal posture        Problem List Patient Active Problem List   Diagnosis Date Noted  . Body mass index 50.0-59.9, adult ([place] 08/15/2018  . Lumbar radiculopathy 07/04/2018  . Encounter to establish care 07/04/2018    CDarrel HooverPT 10/07/2018, 3:08 PM  [facility] [place] [address]Phone: [phone]  Fax:  [phone] Name: GYago LudvigsenMRN: [id number] of Birth: 502-22-1987PHYSICAL THERAPY DISCHARGE SUMMARY  Visits from Start of Care: 1  Current functional level related to goals / functional outcomes: Unknown. He did not return after eval   Remaining deficits: Unknown   Education / Equipment: NA  Plan:  Patient goals were not met. Patient is being discharged due to not returning since the last visit.  ?????    Pearson Forster PT 01/10/19

## 2018-10-15 ENCOUNTER — Ambulatory Visit: Payer: BC Managed Care – PPO | Admitting: Physical Therapy

## 2018-10-26 ENCOUNTER — Other Ambulatory Visit: Payer: Self-pay | Admitting: Family Medicine

## 2018-10-29 ENCOUNTER — Ambulatory Visit: Payer: BC Managed Care – PPO | Admitting: Physical Therapy

## 2018-10-30 ENCOUNTER — Other Ambulatory Visit: Payer: Self-pay | Admitting: Family Medicine

## 2018-11-05 ENCOUNTER — Ambulatory Visit: Payer: BC Managed Care – PPO | Attending: Orthopaedic Surgery | Admitting: Physical Therapy

## 2018-11-05 ENCOUNTER — Telehealth: Payer: Self-pay | Admitting: Physical Therapy

## 2018-11-05 NOTE — Telephone Encounter (Signed)
Left message regarding no show to appointment this morning. Asked patient to call to make additional appointment if he would like to continue with physical therapy. He does not have any future appointments at this time.

## 2018-11-29 DIAGNOSIS — Z0289 Encounter for other administrative examinations: Secondary | ICD-10-CM

## 2018-12-05 ENCOUNTER — Other Ambulatory Visit: Payer: Self-pay | Admitting: Family Medicine

## 2018-12-09 ENCOUNTER — Telehealth: Payer: Self-pay | Admitting: Family Medicine

## 2018-12-09 NOTE — Telephone Encounter (Signed)
1) Medication(s) Requested (by name): -cyclobenzaprine (FLEXERIL) 5 MG tablet   2) Pharmacy of Choice: -[id number] - [place], [address]   Approved medications will be sent to the pharmacy, we will reach out if there is an issue.  Requests made after 3pm may not be addressed until the following business day!

## 2018-12-09 NOTE — Telephone Encounter (Signed)
Refill request routed to provider.

## 2018-12-20 ENCOUNTER — Telehealth: Payer: Self-pay

## 2018-12-20 NOTE — Telephone Encounter (Signed)
Called patient to do their pre-visit COVID screening.  Call went to voicemail. Unable to do prescreening.  

## 2018-12-21 ENCOUNTER — Ambulatory Visit (INDEPENDENT_AMBULATORY_CARE_PROVIDER_SITE_OTHER): Payer: BC Managed Care – PPO | Admitting: Internal Medicine

## 2018-12-21 ENCOUNTER — Ambulatory Visit: Payer: BC Managed Care – PPO | Admitting: Internal Medicine

## 2018-12-21 VITALS — BP 125/85 | HR 86 | Temp 97.3°F | Resp 17 | Ht 70.0 in | Wt 379.4 lb

## 2018-12-21 DIAGNOSIS — B360 Pityriasis versicolor: Secondary | ICD-10-CM

## 2018-12-21 DIAGNOSIS — Z6841 Body Mass Index (BMI) 40.0 and over, adult: Secondary | ICD-10-CM

## 2018-12-21 DIAGNOSIS — F17210 Nicotine dependence, cigarettes, uncomplicated: Secondary | ICD-10-CM | POA: Diagnosis not present

## 2018-12-21 DIAGNOSIS — R030 Elevated blood-pressure reading, without diagnosis of hypertension: Secondary | ICD-10-CM | POA: Diagnosis not present

## 2018-12-21 DIAGNOSIS — Z0000 Encounter for general adult medical examination without abnormal findings: Secondary | ICD-10-CM

## 2018-12-21 DIAGNOSIS — Z0100 Encounter for examination of eyes and vision without abnormal findings: Secondary | ICD-10-CM

## 2018-12-21 DIAGNOSIS — Z0001 Encounter for general adult medical examination with abnormal findings: Secondary | ICD-10-CM | POA: Diagnosis not present

## 2018-12-21 DIAGNOSIS — Z2821 Immunization not carried out because of patient refusal: Secondary | ICD-10-CM

## 2018-12-21 DIAGNOSIS — M5416 Radiculopathy, lumbar region: Secondary | ICD-10-CM

## 2018-12-21 DIAGNOSIS — L819 Disorder of pigmentation, unspecified: Secondary | ICD-10-CM

## 2018-12-21 DIAGNOSIS — E66813 Obesity, class 3: Secondary | ICD-10-CM | POA: Insufficient documentation

## 2018-12-21 DIAGNOSIS — L84 Corns and callosities: Secondary | ICD-10-CM

## 2018-12-21 DIAGNOSIS — G4733 Obstructive sleep apnea (adult) (pediatric): Secondary | ICD-10-CM

## 2018-12-21 NOTE — Progress Notes (Signed)
Patient here for routine physical. Is fasting. Gets flu shot every year through his job.  Has appointment with weight management clinic 12/22/2018

## 2018-12-21 NOTE — Patient Instructions (Addendum)
Try to check your blood pressure once a week.  Normal blood pressure is 120/80 or lower  Preventive Care 9-34 Years Old, Male Preventive care refers to lifestyle choices and visits with your health care provider that can promote health and wellness. This includes:  A yearly physical exam. This is also called an annual well check.  Regular dental and eye exams.  Immunizations.  Screening for certain conditions.  Healthy lifestyle choices, such as eating a healthy diet, getting regular exercise, not using drugs or products that contain nicotine and tobacco, and limiting alcohol use. What can I expect for my preventive care visit? Physical exam Your health care provider will check:  Height and weight. These may be used to calculate body mass index (BMI), which is a measurement that tells if you are at a healthy weight.  Heart rate and blood pressure.  Your skin for abnormal spots. Counseling Your health care provider may ask you questions about:  Alcohol, tobacco, and drug use.  Emotional well-being.  Home and relationship well-being.  Sexual activity.  Eating habits.  Work and work Statistician. What immunizations do I need?  Influenza (flu) vaccine  This is recommended every year. Tetanus, diphtheria, and pertussis (Tdap) vaccine  You may need a Td booster every 10 years. Varicella (chickenpox) vaccine  You may need this vaccine if you have not already been vaccinated. Human papillomavirus (HPV) vaccine  If recommended by your health care provider, you may need three doses over 6 months. Measles, mumps, and rubella (MMR) vaccine  You may need at least one dose of MMR. You may also need a second dose. Meningococcal conjugate (MenACWY) vaccine  One dose is recommended if you are 45-50 years old and a Market researcher living in a residence hall, or if you have one of several medical conditions. You may also need additional booster doses. Pneumococcal  conjugate (PCV13) vaccine  You may need this if you have certain conditions and were not previously vaccinated. Pneumococcal polysaccharide (PPSV23) vaccine  You may need one or two doses if you smoke cigarettes or if you have certain conditions. Hepatitis A vaccine  You may need this if you have certain conditions or if you travel or work in places where you may be exposed to hepatitis A. Hepatitis B vaccine  You may need this if you have certain conditions or if you travel or work in places where you may be exposed to hepatitis B. Haemophilus influenzae type b (Hib) vaccine  You may need this if you have certain risk factors. You may receive vaccines as individual doses or as more than one vaccine together in one shot (combination vaccines). Talk with your health care provider about the risks and benefits of combination vaccines. What tests do I need? Blood tests  Lipid and cholesterol levels. These may be checked every 5 years starting at age 103.  Hepatitis C test.  Hepatitis B test. Screening   Diabetes screening. This is done by checking your blood sugar (glucose) after you have not eaten for a while (fasting).  Sexually transmitted disease (STD) testing. Talk with your health care provider about your test results, treatment options, and if necessary, the need for more tests. Follow these instructions at home: Eating and drinking   Eat a diet that includes fresh fruits and vegetables, whole grains, lean protein, and low-fat dairy products.  Take vitamin and mineral supplements as recommended by your health care provider.  Do not drink alcohol if your health care provider  tells you not to drink.  If you drink alcohol: ? Limit how much you have to 0-2 drinks a day. ? Be aware of how much alcohol is in your drink. In the [place] one drink equals one 12 oz bottle of beer (355 mL), one 5 oz glass of wine (148 mL), or one 1 oz glass of hard liquor (44 mL). Lifestyle  Take  daily care of your teeth and gums.  Stay active. Exercise for at least 30 minutes on 5 or more days each week.  Do not use any products that contain nicotine or tobacco, such as cigarettes, e-cigarettes, and chewing tobacco. If you need help quitting, ask your health care provider.  If you are sexually active, practice safe sex. Use a condom or other form of protection to prevent STIs (sexually transmitted infections). What's next?  Go to your health care provider once a year for a well check visit.  Ask your health care provider how often you should have your eyes and teeth checked.  Stay up to date on all vaccines. This information is not intended to replace advice given to you by your health care provider. Make sure you discuss any questions you have with your health care provider. Document Released: 06/03/2001 Document Revised: 04/01/2018 Document Reviewed: 04/01/2018 Elsevier Patient Education  2020 [employer].

## 2018-12-21 NOTE — Progress Notes (Signed)
Patient ID: Jacob Roberts, male    DOB: 08-31-1985  MRN: [id number]  CC: Annual Exam   Subjective: Jacob Roberts is a 33 y.o. male who presents for annual exam. His concerns today include:  Obesity, elevated blood pressure, blood pressure and RT sided sciatica  RT sided sciatica: Saw Dr. Ophelia Charter.  Had MRI of his back that revealed mild lumbar spinal stenosis with mild left neuroforaminal stenosis and L4-5 facet arthrosis.  Physical therapy was recommended along with weight loss.  - Did two sessions of P.T and felt that it was not giving him the results he wanted for the time sacrificed to attend.  Decided to go route of trying to get wgh down.  Have appt with medical weight management program tomorrow. -still on Mobic and Flexeril both of which he is finding helpful.Marland Kitchen  Helps him sleep better  Obesity:  Has a gym at his place of employment which he plans to use.  Also plans to go to [facility] to swim sometimes. Trying to eat better.  Recently cut back on fried foods. Plans to cut out sweet tea which is a weakness for him.  His wife has been participating in medical weight loss management program for the past year with good results.  He has decided to enroll and has his first appointment tomorrow.  Elev BP: Blood pressure was elevated on last visit.  Plan was to recheck today.  Both parents have hypertension.  Tob Use:  Cigars on weekends.  Feels he wants to quit.  Not sure if he will need help with quitting.  Past medical, surgical, family and social histories reviewed and updated. Patient Active Problem List   Diagnosis Date Noted  . Body mass index 50.0-59.9, adult (HCC) 08/15/2018  . Lumbar radiculopathy 07/04/2018  . Encounter to establish care 07/04/2018     Current Outpatient Medications on File Prior to Visit  Medication Sig Dispense Refill  . cyclobenzaprine (FLEXERIL) 5 MG tablet TAKE 1 TABLET (5 MG TOTAL) BY MOUTH 2 (TWO) TIMES DAILY AS NEEDED FOR MUSCLE SPASMS. 30 tablet 1   . meloxicam (MOBIC) 15 MG tablet TAKE 1 TABLET BY MOUTH EVERY DAY 30 tablet 2   No current facility-administered medications on file prior to visit.     Allergies  Allergen Reactions  . Penicillins Hives  . Sulfa Antibiotics Shortness Of Breath    Social History   Socioeconomic History  . Marital status: Married    Spouse name: Not on file  . Number of children: 2  . Years of education: Not on file  . Highest education level: Not on file  Occupational History  . Not on file  Social Needs  . Financial resource strain: Not on file  . Food insecurity    Worry: Not on file    Inability: Not on file  . Transportation needs    Medical: Not on file    Non-medical: Not on file  Tobacco Use  . Smoking status: Current Every Day Smoker  . Smokeless tobacco: Never Used  Substance and Sexual Activity  . Alcohol use: Yes    Comment: occasional  . Drug use: Never  . Sexual activity: Yes    Partners: Female  Lifestyle  . Physical activity    Days per week: Not on file    Minutes per session: Not on file  . Stress: Not on file  Relationships  . Social Musician on phone: Not on file    Gets  together: Not on file    Attends religious service: Not on file    Active member of club or organization: Not on file    Attends meetings of clubs or organizations: Not on file    Relationship status: Not on file  . Intimate partner violence    Fear of current or ex partner: Not on file    Emotionally abused: Not on file    Physically abused: Not on file    Forced sexual activity: Not on file  Other Topics Concern  . Not on file  Social History Narrative  . Not on file    Family History  Problem Relation Age of Onset  . Hypertension Maternal Grandmother   . Hypertension Mother   . Hypertension Father   . Diabetes Brother   . Stroke Neg Hx   . Hyperlipidemia Neg Hx   . Heart disease Neg Hx     Past Surgical History:  Procedure Laterality Date  . HERNIA REPAIR       ROS: Review of Systems  HENT: Negative for dental problem, hearing loss, sinus pressure, sinus pain and sore throat.   Eyes: Negative for visual disturbance.       Last eye exam was 15 yrs ago.  Would like to be referred  Respiratory: Negative for cough and shortness of breath.        Endorses loud snoring.  Had sleep study several yrs ago and was dx with sleep apnea.  Never got CPAP machine due to cost.  Endorses occasional headachess and has daytime tireness .  Cardiovascular: Negative for chest pain and leg swelling.  Gastrointestinal: Negative for blood in stool.       Moving bowels okay  Genitourinary: Negative for difficulty urinating and hematuria.  Musculoskeletal: Positive for back pain.  Skin:       Has fungal skin infection on arms.  Gets treatment 1-2 x a yr with Ketoconazole or uses Selsum Blue.  Worse in summer.  Nothing now.  Neurological: Negative for seizures and syncope.  Psychiatric/Behavioral: Negative for dysphoric mood. The patient is not nervous/anxious.     PHYSICAL EXAM: BP 125/85   Pulse 86   Temp (!) 97.3 F (36.3 C) (Temporal)   Resp 17   Ht 5\' 10"  (1.778 m)   Wt (!) 379 lb 6.4 oz (172.1 kg)   SpO2 96%   BMI 54.44 kg/m   Wt Readings from Last 3 Encounters:  12/21/18 (!) 379 lb 6.4 oz (172.1 kg)  09/03/18 (!) 375 lb (170.1 kg)  08/13/18 (!) 375 lb (170.1 kg)   Physical Exam  General appearance - alert, well appearing, obese young to middle-aged African-American male and in no distress Mental status - normal mood, behavior, speech, dress, motor activity, and thought processes Eyes - pupils equal and reactive, extraocular eye movements intact Ears - bilateral TM's and external ear canals normal Nose - normal and patent, no erythema, discharge or polyps Mouth - mucous membranes moist, pharynx normal without lesions Neck - supple, no significant adenopathy Lymphatics - no palpable lymphadenopathy, no hepatosplenomegaly Chest - clear to  auscultation, no wheezes, rales or rhonchi, symmetric air entry Heart - normal rate, regular rhythm, normal S1, S2, no murmurs, rubs, clicks or gallops Abdomen - obese, soft, nontender, nondistended, no masses or organomegaly Extremities -extremity edema.  Good peripheral pulses in arms and lower extremities.   Skin -area of hypopigmentation noted over the lower sternum spreading towards the left breast.  1 cm hyperpigmented flat  nonmobile area noted below the left scapula.  Nonulcerative callus on both big toes medially. Breast: Patient has mild gynecomastia  ASSESSMENT AND PLAN: 1. Annual physical exam 2. Morbid obesity (HCC) Discussed the importance of healthy eating habits and moderate intensity aerobic exercise 3 to 4 days a week.  Encouraged him to eliminate sugary drinks from his diet and try to drink more water.  Advised to cut back on white carbohydrates and to eat more white meat than red meat.  Try to incorporate fresh fruits and vegetables into the diet daily. - CBC - Comprehensive metabolic panel - Lipid panel - Hemoglobin A1c  3. Light smoker Advised to quit.  Discussed health risks associated with nicotine use.  Patient ready to give a trial of quitting.  He is not sure that he would need any medication to help him quit but will let me know how he does.  Less than 5 minutes spent on counseling.  4. Elevated blood pressure reading Went over what is considered normal blood pressure which is 120/80 or lower.  DASH diet discussed and encouraged.  He has access to his wife's blood pressure monitoring device.  Encouraged him to check blood pressure at least once a week.  5. OSA (obstructive sleep apnea) Patient declines having sleep test done at this time due to his schedule at work.  He wants to put this off until next year.  I have encourage weight loss.  6. Lumbar radiculopathy Doing better on Flexeril and meloxicam.  I highly favor his intent on trying to get his weight  down.  7. Tinea versicolor We will try over-the-counter Selsun Blue  8. Pre-ulcerative corn or callous Patient states that he will make an appointment with a podiatrist to have the shaved  9. Influenza vaccination declined Plans to get this done at work for 3  10. Eye exam, routine - Ambulatory referral to Ophthalmology  11. Discoloration of skin Advised to have his wife keep an eye on this area that is below the left scapula.  If any change in size or color he should let us know.  Patient was given the opportunity to ask questions.  Patient verbalized understanding of the plan and was able to repeat key elements of the plan.   Orders Placed This Encounter  Procedures  . CBC  . Comprehensive metabolic panel  . Lipid panel  . Hemoglobin A1c  . Ambulatory referral to Ophthalmology     Requested Prescriptions    No prescriptions requested or ordered in this encounter    Return in about 3 months (around 03/22/2019).  Jonah Blueeborah , MD, FACP

## 2018-12-21 NOTE — Progress Notes (Signed)
Patient here for routine physical. Is fasting. Gets flu shots every year through his job.  Has an appointment with weight loss clinic 12/22/2018.

## 2018-12-22 ENCOUNTER — Encounter (INDEPENDENT_AMBULATORY_CARE_PROVIDER_SITE_OTHER): Payer: Self-pay | Admitting: Family Medicine

## 2018-12-22 ENCOUNTER — Ambulatory Visit (INDEPENDENT_AMBULATORY_CARE_PROVIDER_SITE_OTHER): Payer: BC Managed Care – PPO | Admitting: Family Medicine

## 2018-12-22 ENCOUNTER — Encounter: Payer: Self-pay | Admitting: Family Medicine

## 2018-12-22 ENCOUNTER — Other Ambulatory Visit: Payer: Self-pay

## 2018-12-22 VITALS — BP 123/84 | HR 92 | Temp 98.1°F | Ht 70.0 in | Wt 369.0 lb

## 2018-12-22 DIAGNOSIS — R0602 Shortness of breath: Secondary | ICD-10-CM | POA: Diagnosis not present

## 2018-12-22 DIAGNOSIS — Z1331 Encounter for screening for depression: Secondary | ICD-10-CM | POA: Diagnosis not present

## 2018-12-22 DIAGNOSIS — Z6841 Body Mass Index (BMI) 40.0 and over, adult: Secondary | ICD-10-CM

## 2018-12-22 DIAGNOSIS — G4733 Obstructive sleep apnea (adult) (pediatric): Secondary | ICD-10-CM | POA: Diagnosis not present

## 2018-12-22 DIAGNOSIS — R7303 Prediabetes: Secondary | ICD-10-CM

## 2018-12-22 DIAGNOSIS — R5383 Other fatigue: Secondary | ICD-10-CM | POA: Diagnosis not present

## 2018-12-22 DIAGNOSIS — Z0289 Encounter for other administrative examinations: Secondary | ICD-10-CM

## 2018-12-22 DIAGNOSIS — E66813 Obesity, class 3: Secondary | ICD-10-CM

## 2018-12-22 DIAGNOSIS — Z9189 Other specified personal risk factors, not elsewhere classified: Secondary | ICD-10-CM

## 2018-12-22 LAB — COMPREHENSIVE METABOLIC PANEL
ALT: 31 IU/L (ref 0–44)
AST: 22 IU/L (ref 0–40)
Albumin/Globulin Ratio: 1.6 (ref 1.2–2.2)
Albumin: 4.2 g/dL (ref 4.0–5.0)
Alkaline Phosphatase: 99 IU/L (ref 39–117)
BUN/Creatinine Ratio: 10 (ref 9–20)
BUN: 11 mg/dL (ref 6–20)
Bilirubin Total: <0.2 mg/dL (ref 0.0–1.2)
CO2: 23 mmol/L (ref 20–29)
Calcium: 9 mg/dL (ref 8.7–10.2)
Chloride: 102 mmol/L (ref 96–106)
Creatinine, Ser: 1.11 mg/dL (ref 0.76–1.27)
GFR calc Af Amer: 100 mL/min/{1.73_m2} (ref >59)
GFR calc non Af Amer: 86 mL/min/{1.73_m2} (ref >59)
Globulin, Total: 2.6 g/dL (ref 1.5–4.5)
Glucose: 90 mg/dL (ref 65–99)
Potassium: 4.5 mmol/L (ref 3.5–5.2)
Sodium: 142 mmol/L (ref 134–144)
Total Protein: 6.8 g/dL (ref 6.0–8.5)

## 2018-12-22 LAB — CBC
Hematocrit: 45.6 % (ref 37.5–51.0)
Hemoglobin: 15.8 g/dL (ref 13.0–17.7)
MCH: 29.9 pg (ref 26.6–33.0)
MCHC: 34.6 g/dL (ref 31.5–35.7)
MCV: 86 fL (ref 79–97)
Platelets: 285 10*3/uL (ref 150–450)
RBC: 5.28 x10E6/uL (ref 4.14–5.80)
RDW: 13.5 % (ref 11.6–15.4)
WBC: 7.7 10*3/uL (ref 3.4–10.8)

## 2018-12-22 LAB — LIPID PANEL
Chol/HDL Ratio: 4.8 ratio (ref 0.0–5.0)
Cholesterol, Total: 198 mg/dL (ref 100–199)
HDL: 41 mg/dL (ref >39)
LDL Chol Calc (NIH): 144 mg/dL — ABNORMAL HIGH (ref 0–99)
Triglycerides: 73 mg/dL (ref 0–149)
VLDL Cholesterol Cal: 13 mg/dL (ref 5–40)

## 2018-12-22 LAB — HEMOGLOBIN A1C
Est. average glucose Bld gHb Est-mCnc: 117 mg/dL
Hgb A1c MFr Bld: 5.7 % — ABNORMAL HIGH (ref 4.8–5.6)

## 2018-12-22 NOTE — Progress Notes (Signed)
.  Office: [phone]  /  Fax: [phone]   HPI:   Chief Complaint: OBESITY  Jacob Roberts (MR# [id number]) is a 33 y.o. male who presents on 12/22/2018 for obesity evaluation and treatment. Current BMI is Body mass index is 52.95 kg/m. Jacob Roberts has struggled with obesity for years and has been unsuccessful in either losing weight or maintaining long term weight loss. Jacob Roberts attended our information session and states he is currently in the action stage of change and ready to dedicate time achieving and maintaining a healthier weight.  Jacob Roberts was told about our clinic from his wife. For breakfast, he is doing coffee with 1/4 creamer and sugar, sausage and egg biscuit, hash brown, and with or without soda (satisfied). He is not eating snacks. If he is eating breakfast, then he does not eat lunch. If he is not eating breakfast, then lunch would be filet-o-fish or salad ([employer] of salad [place], or [place]), or sandwich, small fries and a soda (feels satisfied). For dinner, he is doing casserole or family meal from [employer].   Jacob Roberts states his family eats meals together he thinks his family will eat healthier with  him his desired weight loss is 149 lbs he has been heavy most of  his life he started gaining weight at [age] years old his heaviest weight ever was 390 lbs he has significant food cravings issues  he snacks frequently in the evenings he skips meals frequently he is frequently drinking liquids with calories he frequently makes poor food choices he has problems with excessive hunger  he frequently eats larger portions than normal    Fatigue Jacob Roberts feels his energy is lower than it should be. This has worsened with weight gain and has not worsened recently. Jacob Roberts admits to daytime somnolence and  admits to waking up still tired. Patient has a history of obstructive sleep apnea. Patent has a history of symptoms of daytime fatigue and morning headache. Patient  generally gets 5 hours of sleep per night, and states they generally have nightime awakenings. Snoring is present. Apneic episodes are present. Epworth Sleepiness Score is 9.  Dyspnea on exertion Jacob Roberts notes increasing shortness of breath with exercising and seems to be worsening over time with weight gain. He notes getting out of breath sooner with activity than he used to. This has not gotten worse recently. EKG-poor R wave progression, normal sinus rhythm at 95 BPM. Jacob Roberts denies orthopnea.  Obstructive Sleep Apnea Jacob Roberts has a historical diagnosis of obstructive sleep apnea. He is supposed to be using CPAP, but isn't secondary to previous insufficient funds.  At risk for cardiovascular disease Jacob Roberts is at a higher than average risk for cardiovascular disease due to obesity and OSA. He currently denies any chest pain.  Pre-Diabetes Jacob Roberts has a diagnosis of pre-diabetes based on his elevated Hgb A1c and was informed this puts him at greater risk of developing diabetes. Last Hgb A1c was of 5.7. He is not taking metformin currently and continues to work on diet and exercise to decrease risk of diabetes. He denies hypoglycemia.  Depression Screen Jacob Roberts Food and Mood (modified PHQ-9) score was  Depression screen PHQ 2/9 12/22/2018  Decreased Interest 0  Down, Depressed, Hopeless 0  PHQ - 2 Score 0  Altered sleeping 3  Tired, decreased energy 2  Change in appetite 0  Feeling bad or failure about yourself  0  Trouble concentrating 1  Moving slowly or fidgety/restless 0  Suicidal thoughts 0  PHQ-9 Score  6  Difficult doing work/chores Not difficult at all    ASSESSMENT AND PLAN:  Other fatigue - Plan: EKG 12-Lead, VITAMIN D 25 Hydroxy (Vit-D Deficiency, Fractures), Vitamin B12, Folate, T3, T4, free, TSH  SOB (shortness of breath) on exertion  OSA (obstructive sleep apnea) - Plan: Ambulatory referral to Neurology  Prediabetes - Plan: Insulin, random  Depression screening   At risk for heart disease  Class 3 severe obesity with serious comorbidity and body mass index (BMI) of 50.0 to 59.9 in adult, unspecified obesity type (HCC)  PLAN:  Fatigue Jacob Roberts was informed that his fatigue may be related to obesity, depression or many other causes. Labs will be ordered, and in the meanwhile Jacob Roberts has agreed to work on diet, exercise and weight loss to help with fatigue. Proper sleep hygiene was discussed including the need for 7-8 hours of quality sleep each night. A sleep study was ordered based on symptoms and Epworth score.  Dyspnea on exertion Jacob Roberts shortness of breath appears to be obesity related and exercise induced. He has agreed to work on weight loss and gradually increase exercise to treat his exercise induced shortness of breath. If Jacob Roberts follows our instructions and loses weight without improvement of his shortness of breath, we will plan to refer to pulmonology. We will monitor this condition regularly. Jacob Roberts agrees to this plan.  Obstructive Sleep Apnea We [facility]. Jacob Roberts agrees to follow up with our clinic in 2 weeks.   Cardiovascular risk counseling Jacob Roberts was given extended (15 minutes) coronary artery disease prevention counseling today. He is 33 y.o. male and has risk factors for heart disease including obesity and OSA. We discussed intensive lifestyle modifications today with an emphasis on specific weight loss instructions and strategies. Pt was also informed of the importance of increasing exercise and decreasing saturated fats to help prevent heart disease.  Pre-Diabetes Jacob Roberts will continue to work on weight loss, exercise, and decreasing simple carbohydrates in his diet to help decrease the risk of diabetes. We dicussed metformin including benefits and risks. He was informed that eating too many simple carbohydrates or too many calories at one sitting increases the likelihood of  GI side effects. We will check insulin level today. Jacob Roberts agrees to follow up with our clinic in 2 weeks as directed to monitor his progress.  Depression Screen Jacob Roberts had a mildly positive depression screening. Depression is commonly associated with obesity and often results in emotional eating behaviors. We will monitor this closely and work on CBT to help improve the non-hunger eating patterns. Referral to Psychology may be required if no improvement is seen as he continues in our clinic.  Obesity Jacob Roberts is currently in the action stage of change and his goal is to continue with weight loss efforts He has agreed to follow the Category 4 plan Jacob Roberts has been instructed to work up to a goal of 150 minutes of combined cardio and strengthening exercise per week for weight loss and overall health benefits. We discussed the following Behavioral Modification Strategies today: increasing lean protein intake, increasing vegetables and work on meal planning and easy cooking plans, keeping healthy foods in the home, and planning for success  Jacob Roberts has agreed to follow up with our clinic in 2 weeks. He was informed of the importance of frequent follow up visits to maximize his success with intensive lifestyle modifications for his multiple health conditions. He was informed we would discuss his lab results  at his next visit unless there is a critical issue that needs to be addressed sooner. Jacob Roberts agreed to keep his next visit at the agreed upon time to discuss these results.  ALLERGIES: Allergies  Allergen Reactions  . Penicillins Hives  . Sulfa Antibiotics Shortness Of Breath    MEDICATIONS: Current Outpatient Medications on File Prior to Visit  Medication Sig Dispense Refill  . cyclobenzaprine (FLEXERIL) 5 MG tablet TAKE 1 TABLET (5 MG TOTAL) BY MOUTH 2 (TWO) TIMES DAILY AS NEEDED FOR MUSCLE SPASMS. 30 tablet 1  . meloxicam (MOBIC) 15 MG tablet TAKE 1 TABLET BY MOUTH EVERY DAY 30 tablet 2    No current facility-administered medications on file prior to visit.     PAST MEDICAL HISTORY: Past Medical History:  Diagnosis Date  . Essential hypertension   . Heartburn   . Low back pain   . Morbid obesity (HCC)   . Obstructive sleep apnea     PAST SURGICAL HISTORY: Past Surgical History:  Procedure Laterality Date  . HERNIA REPAIR      SOCIAL HISTORY: Social History   Tobacco Use  . Smoking status: Current Every Day Smoker  . Smokeless tobacco: Never Used  Substance Use Topics  . Alcohol use: Yes    Comment: occasional  . Drug use: Never    FAMILY HISTORY: Family History  Problem Relation Age of Onset  . Hypertension Maternal Grandmother   . Hypertension Mother   . Thyroid disease Mother   . Obesity Mother   . Hypertension Father   . Obesity Father   . Diabetes Brother   . Stroke Neg Hx   . Hyperlipidemia Neg Hx   . Heart disease Neg Hx     ROS: Review of Systems  Constitutional: Positive for malaise/fatigue. Negative for weight loss.  Respiratory: Positive for shortness of breath (with exertion).   Cardiovascular: Negative for chest pain and orthopnea.  Skin:       + Dryness  Neurological: Positive for headaches.  Endo/Heme/Allergies:       Negative hypoglycemia    PHYSICAL EXAM: Blood pressure 123/84, pulse 92, temperature 98.1 F (36.7 C), temperature source Oral, height 5\' 10"  (1.778 m), weight (!) 369 lb (167.4 kg), SpO2 97 %. Body mass index is 52.95 kg/m. Physical Exam Vitals signs reviewed.  Constitutional:      Appearance: Normal appearance. He is obese.  HENT:     Head: Normocephalic and atraumatic.     Nose: Nose normal.  Eyes:     General: No scleral icterus.    Extraocular Movements: Extraocular movements intact.  Neck:     Musculoskeletal: Normal range of motion and neck supple.     Comments: No thyromegaly present Cardiovascular:     Rate and Rhythm: Normal rate and regular rhythm.     Pulses: Normal pulses.      Heart sounds: Normal heart sounds.  Pulmonary:     Effort: Pulmonary effort is normal. No respiratory distress.     Breath sounds: Normal breath sounds.  Abdominal:     Palpations: Abdomen is soft.     Tenderness: There is no abdominal tenderness.     Comments: + Obesity  Musculoskeletal: Normal range of motion.     Right lower leg: No edema.     Left lower leg: No edema.  Skin:    General: Skin is warm and dry.  Neurological:     Mental Status: He is alert and oriented to person, place, and time.  Coordination: Coordination normal.  Psychiatric:        Mood and Affect: Mood normal.        Behavior: Behavior normal.     RECENT LABS AND TESTS: BMET    Component Value Date/Time   NA 142 12/21/2018 1114   K 4.5 12/21/2018 1114   CL 102 12/21/2018 1114   CO2 23 12/21/2018 1114   GLUCOSE 90 12/21/2018 1114   BUN 11 12/21/2018 1114   CREATININE 1.11 12/21/2018 1114   CALCIUM 9.0 12/21/2018 1114   GFRNONAA 86 12/21/2018 1114   GFRAA 100 12/21/2018 1114   Lab Results  Component Value Date   HGBA1C 5.7 (H) 12/21/2018   No results found for: INSULIN CBC    Component Value Date/Time   WBC 7.7 12/21/2018 1114   RBC 5.28 12/21/2018 1114   HGB 15.8 12/21/2018 1114   HCT 45.6 12/21/2018 1114   PLT 285 12/21/2018 1114   MCV 86 12/21/2018 1114   MCH 29.9 12/21/2018 1114   MCHC 34.6 12/21/2018 1114   RDW 13.5 12/21/2018 1114   Iron/TIBC/Ferritin/ %Sat No results found for: IRON, TIBC, FERRITIN, IRONPCTSAT Lipid Panel     Component Value Date/Time   CHOL 198 12/21/2018 1114   TRIG 73 12/21/2018 1114   HDL 41 12/21/2018 1114   CHOLHDL 4.8 12/21/2018 1114   Hepatic Function Panel     Component Value Date/Time   PROT 6.8 12/21/2018 1114   ALBUMIN 4.2 12/21/2018 1114   AST 22 12/21/2018 1114   ALT 31 12/21/2018 1114   ALKPHOS 99 12/21/2018 1114   BILITOT <0.2 12/21/2018 1114   No results found for: TSH Vitamin D No recent labs  ECG  shows NSR with a rate of  95 BPM INDIRECT CALORIMETER done today shows a VO2 of 382 and a REE of 2660. His calculated basal metabolic rate is 6967 thus his basal metabolic rate is worse than expected.       OBESITY BEHAVIORAL INTERVENTION VISIT  Today's visit was # 1   Starting weight: 369 lbs Starting date: 12/22/2018 Today's weight : 369 lbs Today's date: 12/22/2018 Total lbs lost to date: 0    ASK: We discussed the diagnosis of obesity with Jacob Roberts today and Jacob Roberts agreed to give [place] permission to discuss obesity behavioral modification therapy today.  ASSESS: Rodolphe has the diagnosis of obesity and his BMI today is 52.95 Gevin is in the action stage of change   ADVISE: Yazir was educated on the multiple health risks of obesity as well as the benefit of weight loss to improve his health. He was advised of the need for long term treatment and the importance of lifestyle modifications to improve his current health and to decrease his risk of future health problems.  AGREE: Multiple dietary modification options and treatment options were discussed and  Jacob Roberts agreed to follow the recommendations documented in the above note.  ARRANGE: Jacob Roberts was educated on the importance of frequent visits to treat obesity as outlined per CMS and USPSTF guidelines and agreed to schedule his next follow up appointment today.   I, Trixie Dredge, am acting as transcriptionist for Ilene Qua, MD   I have reviewed the above documentation for accuracy and completeness, and I agree with the above. - Ilene Qua, MD

## 2018-12-23 ENCOUNTER — Telehealth: Payer: Self-pay | Admitting: Family Medicine

## 2018-12-23 LAB — T3: T3, Total: 156 ng/dL (ref 71–180)

## 2018-12-23 LAB — INSULIN, RANDOM: INSULIN: 17 u[IU]/mL (ref 2.6–24.9)

## 2018-12-23 LAB — FOLATE: Folate: 8.3 ng/mL (ref >3.0)

## 2018-12-23 LAB — T4, FREE: Free T4: 1.36 ng/dL (ref 0.82–1.77)

## 2018-12-23 LAB — TSH: TSH: 0.935 u[IU]/mL (ref 0.450–4.500)

## 2018-12-23 LAB — VITAMIN D 25 HYDROXY (VIT D DEFICIENCY, FRACTURES): Vit D, 25-Hydroxy: 14.2 ng/mL — ABNORMAL LOW (ref 30.0–100.0)

## 2018-12-23 LAB — VITAMIN B12: Vitamin B-12: 323 pg/mL (ref 232–1245)

## 2018-12-23 NOTE — Telephone Encounter (Signed)
Pt would like call back with lab results. He missed previous call.

## 2019-01-05 ENCOUNTER — Ambulatory Visit (INDEPENDENT_AMBULATORY_CARE_PROVIDER_SITE_OTHER): Payer: BLUE CROSS/BLUE SHIELD | Admitting: Family Medicine

## 2019-01-05 ENCOUNTER — Other Ambulatory Visit: Payer: Self-pay

## 2019-01-05 VITALS — BP 128/85 | HR 91 | Temp 98.1°F | Ht 70.0 in | Wt 362.0 lb

## 2019-01-05 DIAGNOSIS — R7303 Prediabetes: Secondary | ICD-10-CM

## 2019-01-05 DIAGNOSIS — E559 Vitamin D deficiency, unspecified: Secondary | ICD-10-CM

## 2019-01-05 DIAGNOSIS — Z6841 Body Mass Index (BMI) 40.0 and over, adult: Secondary | ICD-10-CM

## 2019-01-05 DIAGNOSIS — Z9189 Other specified personal risk factors, not elsewhere classified: Secondary | ICD-10-CM | POA: Diagnosis not present

## 2019-01-05 MED ORDER — VITAMIN D (ERGOCALCIFEROL) 1.25 MG (50000 UNIT) PO CAPS: 50000.0000 [IU] | ORAL_CAPSULE | ORAL | 0 refills | Status: DC

## 2019-01-06 NOTE — Progress Notes (Signed)
Office: [phone]  /  Fax: [phone]   HPI:   Chief Complaint: OBESITY Jacob Roberts is here to discuss his progress with his obesity treatment plan. He is on the Category 4 plan and is following his eating plan approximately 90 % of the time. He states he is exercising 0 minutes 0 times per week. Jacob Roberts voices the first 2 weeks was difficult in terms of changing meal plan and prepping. He voices struggle with breakfast, and is difficult because of timing, and getting food together with time. He is getting in all lunch and dinner, and he has used chicken voila. His snacks are, fruit, fruit cup, pretzels, and granola bar. He is trying not to eat after 6-7 pm, but he is doing [employer] low calorie fudge bar. He is occasionally indulging.  His weight is (!) 362 lb (164.2 kg) today and has had a weight loss of 7 pounds over a period of 2 weeks since his last visit. He has lost 7 lbs since starting treatment with [place].  Vitamin D Deficiency Jacob Roberts has a diagnosis of vitamin D deficiency. He is not currently taking Vit D. He notes fatigue and denies nausea, vomiting or muscle weakness.  At risk for osteopenia and osteoporosis Jacob Roberts is at higher risk of osteopenia and osteoporosis due to vitamin D deficiency.   Pre-Diabetes Jacob Roberts has a diagnosis of pre-diabetes based on his elevated Hgb A1c and was informed this puts him at greater risk of developing diabetes. Last Hgb a1c was 5.7 and insulin of 17.0. He has a family history of diabetes mellitus. He is not taking metformin currently and continues to work on diet and exercise to decrease risk of diabetes. He denies nausea or hypoglycemia.  ASSESSMENT AND PLAN:  Vitamin D deficiency - Plan: Vitamin D, Ergocalciferol, (DRISDOL) 1.25 MG (50000 UT) CAPS capsule  Prediabetes  At risk for osteoporosis  Class 3 severe obesity with serious comorbidity and body mass index (BMI) of 50.0 to 59.9 in adult, unspecified obesity type (HCC)  PLAN:   Vitamin D Deficiency Jacob Roberts was informed that low vitamin D levels contributes to fatigue and are associated with obesity, breast, and colon cancer. Jacob Roberts agrees to start prescription Vit D 50,000 IU every week #4 with no refills. He will follow up for routine testing of vitamin D, at least 2-3 times per year. He was informed of the risk of over-replacement of vitamin D and agrees to not increase his dose unless he discusses this with [place] first. Jacob Roberts agrees to follow up with our clinic in 2 weeks.  At risk for osteopenia and osteoporosis Jacob Roberts was given extended (30 minutes) osteoporosis prevention counseling today. Jacob Roberts is at risk for osteopenia and osteoporsis due to his vitamin D deficiency. He was encouraged to take his vitamin D and follow his higher calcium diet and increase strengthening exercise to help strengthen his bones and decrease his risk of osteopenia and osteoporosis.  Pre-Diabetes Jacob Roberts will continue to work on weight loss, exercise, and decreasing simple carbohydrates in his diet to help decrease the risk of diabetes. We dicussed metformin including benefits and risks. He was informed that eating too many simple carbohydrates or too many calories at one sitting increases the likelihood of GI side effects. We will repeat labs in 3 months. Jacob Roberts agrees to follow up with [place] as directed to monitor his progress.  Obesity Jacob Roberts is currently in the action stage of change. As such, his goal is to continue with weight loss efforts He has agreed  to keep a food journal with 1750-1900 calories and 125+ grams of protein daily or follow the Category 4 plan Jacob Roberts has been instructed to work up to a goal of 150 minutes of combined cardio and strengthening exercise per week for weight loss and overall health benefits. We discussed the following Behavioral Modification Strategies today: increasing lean protein intake, increasing vegetables and work on meal planning and easy cooking  plans, keeping healthy foods in the home, and planning for success We will refer to Jacob Roberts for bariatric Roberts.  Jacob Roberts has agreed to follow up with our clinic in 2 weeks. He was informed of the importance of frequent follow up visits to maximize his success with intensive lifestyle modifications for his multiple health conditions.  ALLERGIES: Allergies  Allergen Reactions  . Penicillins Hives  . Sulfa Antibiotics Shortness Of Breath    MEDICATIONS: Current Outpatient Medications on File Prior to Visit  Medication Sig Dispense Refill  . cyclobenzaprine (FLEXERIL) 5 MG tablet TAKE 1 TABLET (5 MG TOTAL) BY MOUTH 2 (TWO) TIMES DAILY AS NEEDED FOR MUSCLE SPASMS. 30 tablet 1  . meloxicam (MOBIC) 15 MG tablet TAKE 1 TABLET BY MOUTH EVERY DAY 30 tablet 2   No current facility-administered medications on file prior to visit.     PAST MEDICAL HISTORY: Past Medical History:  Diagnosis Date  . Essential hypertension   . Heartburn   . Low back pain   . Morbid obesity (HCC)   . Obstructive sleep apnea     PAST SURGICAL HISTORY: Past Surgical History:  Procedure Laterality Date  . HERNIA REPAIR      SOCIAL HISTORY: Social History   Tobacco Use  . Smoking status: Current Every Day Smoker  . Smokeless tobacco: Never Used  Substance Use Topics  . Alcohol use: Yes    Comment: occasional  . Drug use: Never    FAMILY HISTORY: Family History  Problem Relation Age of Onset  . Hypertension Maternal Grandmother   . Hypertension Mother   . Thyroid disease Mother   . Obesity Mother   . Hypertension Father   . Obesity Father   . Diabetes Brother   . Stroke Neg Hx   . Hyperlipidemia Neg Hx   . Heart disease Neg Hx     ROS: Review of Systems  Constitutional: Positive for malaise/fatigue and weight loss.  Gastrointestinal: Negative for nausea and vomiting.  Musculoskeletal:       Negative muscle weakness  Endo/Heme/Allergies:       Negative hypoglycemia     PHYSICAL EXAM: Blood pressure 128/85, pulse 91, temperature 98.1 F (36.7 C), temperature source Oral, height 5\' 10"  (1.778 m), weight (!) 362 lb (164.2 kg), SpO2 98 %. Body mass index is 51.94 kg/m. Physical Exam Vitals signs reviewed.  Constitutional:      Appearance: Normal appearance. He is obese.  Cardiovascular:     Rate and Rhythm: Normal rate.     Pulses: Normal pulses.  Pulmonary:     Effort: Pulmonary effort is normal.     Breath sounds: Normal breath sounds.  Musculoskeletal: Normal range of motion.  Skin:    General: Skin is warm and dry.  Neurological:     Mental Status: He is alert and oriented to person, place, and time.  Psychiatric:        Mood and Affect: Mood normal.        Behavior: Behavior normal.     RECENT LABS AND TESTS: BMET    Component  Value Date/Time   NA 142 12/21/2018 1114   K 4.5 12/21/2018 1114   CL 102 12/21/2018 1114   CO2 23 12/21/2018 1114   GLUCOSE 90 12/21/2018 1114   BUN 11 12/21/2018 1114   CREATININE 1.11 12/21/2018 1114   CALCIUM 9.0 12/21/2018 1114   GFRNONAA 86 12/21/2018 1114   GFRAA 100 12/21/2018 1114   Lab Results  Component Value Date   HGBA1C 5.7 (H) 12/21/2018   Lab Results  Component Value Date   INSULIN 17.0 12/22/2018   CBC    Component Value Date/Time   WBC 7.7 12/21/2018 1114   RBC 5.28 12/21/2018 1114   HGB 15.8 12/21/2018 1114   HCT 45.6 12/21/2018 1114   PLT 285 12/21/2018 1114   MCV 86 12/21/2018 1114   MCH 29.9 12/21/2018 1114   MCHC 34.6 12/21/2018 1114   RDW 13.5 12/21/2018 1114   Iron/TIBC/Ferritin/ %Sat No results found for: IRON, TIBC, FERRITIN, IRONPCTSAT Lipid Panel     Component Value Date/Time   CHOL 198 12/21/2018 1114   TRIG 73 12/21/2018 1114   HDL 41 12/21/2018 1114   CHOLHDL 4.8 12/21/2018 1114   Hepatic Function Panel     Component Value Date/Time   PROT 6.8 12/21/2018 1114   ALBUMIN 4.2 12/21/2018 1114   AST 22 12/21/2018 1114   ALT 31 12/21/2018 1114    ALKPHOS 99 12/21/2018 1114   BILITOT <0.2 12/21/2018 1114      Component Value Date/Time   TSH 0.935 12/22/2018 1220      OBESITY BEHAVIORAL INTERVENTION VISIT  Today's visit was # 2   Starting weight: 369 lbs Starting date: 12/22/2018 Today's weight : 362 lbs Today's date: 01/05/2019 Total lbs lost to date: 7    ASK: We discussed the diagnosis of obesity with Jacob Roberts today and Jacob Roberts agreed to give [place] permission to discuss obesity behavioral modification therapy today.  ASSESS: Jemar has the diagnosis of obesity and his BMI today is 51.94 Ralphael is in the action stage of change   ADVISE: Anuel was educated on the multiple health risks of obesity as well as the benefit of weight loss to improve his health. He was advised of the need for long term treatment and the importance of lifestyle modifications to improve his current health and to decrease his risk of future health problems.  AGREE: Multiple dietary modification options and treatment options were discussed and  Oluwatimilehin agreed to follow the recommendations documented in the above note.  ARRANGE: Mayo was educated on the importance of frequent visits to treat obesity as outlined per CMS and USPSTF guidelines and agreed to schedule his next follow up appointment today.  I, Trixie Dredge, am acting as transcriptionist for Ilene Qua, MD  I have reviewed the above documentation for accuracy and completeness, and I agree with the above. - Ilene Qua, MD

## 2019-01-13 ENCOUNTER — Other Ambulatory Visit: Payer: Self-pay | Admitting: Family Medicine

## 2019-01-13 NOTE — Telephone Encounter (Signed)
1) Medication(s) Requested (by name): cyclobenzaprine (FLEXERIL) 5 MG tablet [[id number]]   2) Pharmacy of Choice:  [id number] - [place] [employer], [address]    Patients also requesting lab results   Approved medications will be sent to pharmacy, we will reach out to you if there is an issue.  Requests made after 3pm may not be addressed until following business day!

## 2019-01-18 ENCOUNTER — Ambulatory Visit (INDEPENDENT_AMBULATORY_CARE_PROVIDER_SITE_OTHER): Payer: BC Managed Care – PPO | Admitting: Family Medicine

## 2019-01-18 ENCOUNTER — Other Ambulatory Visit: Payer: Self-pay

## 2019-01-18 VITALS — BP 128/84 | HR 97 | Temp 98.7°F | Ht 70.0 in | Wt 362.0 lb

## 2019-01-18 DIAGNOSIS — Z6841 Body Mass Index (BMI) 40.0 and over, adult: Secondary | ICD-10-CM | POA: Diagnosis not present

## 2019-01-18 DIAGNOSIS — E559 Vitamin D deficiency, unspecified: Secondary | ICD-10-CM

## 2019-01-18 DIAGNOSIS — R7303 Prediabetes: Secondary | ICD-10-CM

## 2019-01-19 NOTE — Progress Notes (Signed)
Office: [phone]  /  Fax: [phone]   HPI:   Chief Complaint: OBESITY Jacob Roberts is here to discuss his progress with his obesity treatment plan. He is on the keep a food journal with 1750-1900 calories and 125+ grams of protein daily and follow the Category 4 plan and is following his eating plan approximately 60-75 % of the time. He states he is exercising 0 minutes 0 times per week. Jacob Roberts has maintained his weight since his last appointment. He doesn't think he has been able to eat most calories on plan. He notes work has been busy and so he has been occasionally skipping meals. This is rare but still happening.  His weight is (!) 362 lb (164.2 kg) today and has not lost weight since his last visit. He has lost 7 lbs since starting treatment with [place].  Pre-Diabetes Jacob Roberts has a diagnosis of pre-diabetes based on his elevated Hgb A1c and was informed this puts him at greater risk of developing diabetes. Last Hgb A1c of 5.7 and insulin of 17.0. He is taking metformin currently and continues to work on diet and exercise to decrease risk of diabetes. He denies hypoglycemia.  Vitamin D Deficiency Jacob Roberts has a diagnosis of vitamin D deficiency. He is currently taking prescription Vit D. He notes fatigue and denies nausea, vomiting or muscle weakness.  ASSESSMENT AND PLAN:  Prediabetes  Vitamin D deficiency  Class 3 severe obesity with serious comorbidity and body mass index (BMI) of 50.0 to 59.9 in adult, unspecified obesity type ([place])  PLAN:  Pre-Diabetes Jacob Roberts will continue to work on weight loss, exercise, and decreasing simple carbohydrates in his diet to help decrease the risk of diabetes. We dicussed metformin including benefits and risks. He was informed that eating too many simple carbohydrates or too many calories at one sitting increases the likelihood of GI side effects. We will re-discuss metformin at next lab draw. Jacob Roberts agrees to follow up with our clinic in 2  weeks as directed to monitor his progress.  Vitamin D Deficiency Jacob Roberts was informed that low vitamin D levels contributes to fatigue and are associated with obesity, breast, and colon cancer. Jacob Roberts agrees to continue taking prescription Vit D 50,000 IU every week, no refill needed. He will follow up for routine testing of vitamin D, at least 2-3 times per year. He was informed of the risk of over-replacement of vitamin D and agrees to not increase his dose unless he discusses this with [place] first. Jacob Roberts agrees to follow up with our clinic in 2 weeks.  I spent > than 50% of the 15 minute visit on counseling as documented in the note.  Obesity Jacob Roberts is currently in the action stage of change. As such, his goal is to continue with weight loss efforts He has agreed to follow the Category 4 plan Jacob Roberts has been instructed to work up to a goal of 150 minutes of combined cardio and strengthening exercise per week for weight loss and overall health benefits. We discussed the following Behavioral Modification Strategies today: increasing lean protein intake, increasing vegetables and work on meal planning and easy cooking plans, keeping healthy foods in the home, and planning for success   Jacob Roberts has agreed to follow up with our clinic in 2 weeks. He was informed of the importance of frequent follow up visits to maximize his success with intensive lifestyle modifications for his multiple health conditions.  ALLERGIES: Allergies  Allergen Reactions  . Penicillins Hives  . Sulfa Antibiotics Shortness  Of Breath    MEDICATIONS: Current Outpatient Medications on File Prior to Visit  Medication Sig Dispense Refill  . cyclobenzaprine (FLEXERIL) 5 MG tablet TAKE 1 TABLET (5 MG TOTAL) BY MOUTH 2 (TWO) TIMES DAILY AS NEEDED FOR MUSCLE SPASMS. 30 tablet 1  . meloxicam (MOBIC) 15 MG tablet TAKE 1 TABLET BY MOUTH EVERY DAY 30 tablet 2  . Vitamin D, Ergocalciferol, (DRISDOL) 1.25 MG (50000 UT) CAPS  capsule Take 1 capsule (50,000 Units total) by mouth every 7 (seven) days. 4 capsule 0   No current facility-administered medications on file prior to visit.     PAST MEDICAL HISTORY: Past Medical History:  Diagnosis Date  . Essential hypertension   . Heartburn   . Low back pain   . Morbid obesity (HCC)   . Obstructive sleep apnea     PAST SURGICAL HISTORY: Past Surgical History:  Procedure Laterality Date  . HERNIA REPAIR      SOCIAL HISTORY: Social History   Tobacco Use  . Smoking status: Current Every Day Smoker  . Smokeless tobacco: Never Used  Substance Use Topics  . Alcohol use: Yes    Comment: occasional  . Drug use: Never    FAMILY HISTORY: Family History  Problem Relation Age of Onset  . Hypertension Maternal Grandmother   . Hypertension Mother   . Thyroid disease Mother   . Obesity Mother   . Hypertension Father   . Obesity Father   . Diabetes Brother   . Stroke Neg Hx   . Hyperlipidemia Neg Hx   . Heart disease Neg Hx     ROS: Review of Systems  Constitutional: Positive for malaise/fatigue. Negative for weight loss.  Gastrointestinal: Negative for nausea and vomiting.  Musculoskeletal:       Negative muscle weakness  Endo/Heme/Allergies:       Negative hypoglycemia    PHYSICAL EXAM: Blood pressure 128/84, pulse 97, temperature 98.7 F (37.1 C), temperature source Oral, height 5\' 10"  (1.778 m), weight (!) 362 lb (164.2 kg), SpO2 99 %. Body mass index is 51.94 kg/m. Physical Exam Vitals signs reviewed.  Constitutional:      Appearance: Normal appearance. He is obese.  Cardiovascular:     Rate and Rhythm: Normal rate.     Pulses: Normal pulses.  Pulmonary:     Effort: Pulmonary effort is normal.     Breath sounds: Normal breath sounds.  Musculoskeletal: Normal range of motion.  Skin:    General: Skin is warm and dry.  Neurological:     Mental Status: He is alert and oriented to person, place, and time.  Psychiatric:        Mood  and Affect: Mood normal.        Behavior: Behavior normal.     RECENT LABS AND TESTS: BMET    Component Value Date/Time   NA 142 12/21/2018 1114   K 4.5 12/21/2018 1114   CL 102 12/21/2018 1114   CO2 23 12/21/2018 1114   GLUCOSE 90 12/21/2018 1114   BUN 11 12/21/2018 1114   CREATININE 1.11 12/21/2018 1114   CALCIUM 9.0 12/21/2018 1114   GFRNONAA 86 12/21/2018 1114   GFRAA 100 12/21/2018 1114   Lab Results  Component Value Date   HGBA1C 5.7 (H) 12/21/2018   Lab Results  Component Value Date   INSULIN 17.0 12/22/2018   CBC    Component Value Date/Time   WBC 7.7 12/21/2018 1114   RBC 5.28 12/21/2018 1114   HGB 15.8 12/21/2018  1114   HCT 45.6 12/21/2018 1114   PLT 285 12/21/2018 1114   MCV 86 12/21/2018 1114   MCH 29.9 12/21/2018 1114   MCHC 34.6 12/21/2018 1114   RDW 13.5 12/21/2018 1114   Iron/TIBC/Ferritin/ %Sat No results found for: IRON, TIBC, FERRITIN, IRONPCTSAT Lipid Panel     Component Value Date/Time   CHOL 198 12/21/2018 1114   TRIG 73 12/21/2018 1114   HDL 41 12/21/2018 1114   CHOLHDL 4.8 12/21/2018 1114   LDLCALC 144 (H) 12/21/2018 1114   Hepatic Function Panel     Component Value Date/Time   PROT 6.8 12/21/2018 1114   ALBUMIN 4.2 12/21/2018 1114   AST 22 12/21/2018 1114   ALT 31 12/21/2018 1114   ALKPHOS 99 12/21/2018 1114   BILITOT <0.2 12/21/2018 1114      Component Value Date/Time   TSH 0.935 12/22/2018 1220      OBESITY BEHAVIORAL INTERVENTION VISIT  Today's visit was # 3   Starting weight: 369 lbs Starting date: 12/22/2018 Today's weight : 362 lbs Today's date: 01/18/2019 Total lbs lost to date: 7    ASK: We discussed the diagnosis of obesity with Lytle Michaels today and Earl Lites agreed to give [place] permission to discuss obesity behavioral modification therapy today.  ASSESS: Braidan has the diagnosis of obesity and his BMI today is 51.94 Shamar is in the action stage of change   ADVISE: Morty was educated on the  multiple health risks of obesity as well as the benefit of weight loss to improve his health. He was advised of the need for long term treatment and the importance of lifestyle modifications to improve his current health and to decrease his risk of future health problems.  AGREE: Multiple dietary modification options and treatment options were discussed and  Adriann agreed to follow the recommendations documented in the above note.  ARRANGE: Ahmir was educated on the importance of frequent visits to treat obesity as outlined per CMS and USPSTF guidelines and agreed to schedule his next follow up appointment today.  I, Burt Knack, am acting as transcriptionist for Debbra Riding, MD  I have reviewed the above documentation for accuracy and completeness, and I agree with the above. - Debbra Riding, MD

## 2019-02-02 ENCOUNTER — Ambulatory Visit (INDEPENDENT_AMBULATORY_CARE_PROVIDER_SITE_OTHER): Payer: BC Managed Care – PPO | Admitting: Family Medicine

## 2019-02-02 ENCOUNTER — Other Ambulatory Visit: Payer: Self-pay

## 2019-02-02 VITALS — BP 135/82 | HR 94 | Temp 98.2°F | Ht 70.0 in | Wt 358.0 lb

## 2019-02-02 DIAGNOSIS — Z9189 Other specified personal risk factors, not elsewhere classified: Secondary | ICD-10-CM | POA: Diagnosis not present

## 2019-02-02 DIAGNOSIS — R7303 Prediabetes: Secondary | ICD-10-CM

## 2019-02-02 DIAGNOSIS — E559 Vitamin D deficiency, unspecified: Secondary | ICD-10-CM

## 2019-02-02 DIAGNOSIS — Z6841 Body Mass Index (BMI) 40.0 and over, adult: Secondary | ICD-10-CM

## 2019-02-02 MED ORDER — VITAMIN D (ERGOCALCIFEROL) 1.25 MG (50000 UNIT) PO CAPS: 50000.0000 [IU] | ORAL_CAPSULE | ORAL | 0 refills | Status: DC

## 2019-02-03 NOTE — Progress Notes (Signed)
Office: [phone]  /  Fax: [phone]   HPI:   Chief Complaint: OBESITY Jacob Roberts is here to discuss his progress with his obesity treatment plan. He is on the Category 4 plan and is following his eating plan approximately 50 % of the time. He states he is exercising 0 minutes 0 times per week. Jacob Roberts enjoyed his anniversary weekend after his last appointment. Work has increased in demands secondary to being apart of the [employer]. He is able to get breakfast and lunch in like the plan states. Dinner is still up in the air in terms of staying close to the plan.  His weight is (!) 358 lb (162.4 kg) today and has had a weight loss of 4 pounds over a period of 2 weeks since his last visit. He has lost 11 lbs since starting treatment with us.  Vitamin D Deficiency Jacob Roberts has a diagnosis of vitamin D deficiency. He is currently taking prescription Vit D. She notes fatigue and denies nausea, vomiting or muscle weakness.  At risk for osteopenia and osteoporosis Jacob Roberts is at higher risk of osteopenia and osteoporosis due to vitamin D deficiency.   Pre-Diabetes Jacob Roberts has a diagnosis of pre-diabetes based on his elevated Hgb A1c and was informed this puts him at greater risk of developing diabetes. He is not on any medications and is still having carbohydrate cravings and occasionally overindulging. He continues to work on diet and exercise to decrease risk of diabetes. He denies nausea or hypoglycemia.  ASSESSMENT AND PLAN:  At risk for osteoporosis  Vitamin D deficiency - Plan: Vitamin D, Ergocalciferol, (DRISDOL) 1.25 MG (50000 UT) CAPS capsule  Prediabetes  Class 3 severe obesity with serious comorbidity and body mass index (BMI) of 50.0 to 59.9 in adult, unspecified obesity type (HCC)  PLAN:  Vitamin D Deficiency Jacob Roberts was informed that low vitamin D levels contributes to fatigue and are associated with obesity, breast, and colon cancer. Jacob Roberts agrees to continue  taking prescription Vit D 50,000 IU every week #4 and we will refill for 1 month. He will follow up for routine testing of vitamin D, at least 2-3 times per year. He was informed of the risk of over-replacement of vitamin D and agrees to not increase his dose unless he discusses this with us first. Jacob Roberts agrees to follow up with our clinic in 2 weeks.  At risk for osteopenia and osteoporosis Jacob Roberts was given extended (15 minutes) osteoporosis prevention counseling today. Jacob Roberts is at risk for osteopenia and osteoporsis due to his vitamin D deficiency. He was encouraged to take his vitamin D and follow his higher calcium diet and increase strengthening exercise to help strengthen his bones and decrease his risk of osteopenia and osteoporosis.  Pre-Diabetes Jacob Roberts will continue to work on weight loss, exercise, and decreasing simple carbohydrates in his diet to help decrease the risk of diabetes. We dicussed metformin including benefits and risks. He was informed that eating too many simple carbohydrates or too many calories at one sitting increases the likelihood of GI side effects. We will repeat labs in December 2020. Jacob Roberts agrees to follow up with us as directed to monitor his progress.  Obesity Jacob Roberts is currently in the action stage of change. As such, his goal is to continue with weight loss efforts He has agreed to keep a food journal with 450-600 calories and 40+ grams of protein at supper daily and follow the Category 4 plan Jacob Roberts has been instructed to work up to a goal  of 150 minutes of combined cardio and strengthening exercise per week for weight loss and overall health benefits. We discussed the following Behavioral Modification Strategies today: increasing lean protein intake, increasing vegetables and work on meal planning and easy cooking plans, keeping healthy foods in the home, and planning for success   Jacob Roberts has agreed to follow up with our clinic in 2 weeks. He was  informed of the importance of frequent follow up visits to maximize his success with intensive lifestyle modifications for his multiple health conditions.  ALLERGIES: Allergies  Allergen Reactions  . Penicillins Hives  . Sulfa Antibiotics Shortness Of Breath    MEDICATIONS: Current Outpatient Medications on File Prior to Visit  Medication Sig Dispense Refill  . cyclobenzaprine (FLEXERIL) 5 MG tablet TAKE 1 TABLET (5 MG TOTAL) BY MOUTH 2 (TWO) TIMES DAILY AS NEEDED FOR MUSCLE SPASMS. 30 tablet 1  . meloxicam (MOBIC) 15 MG tablet TAKE 1 TABLET BY MOUTH EVERY DAY 30 tablet 2   No current facility-administered medications on file prior to visit.     PAST MEDICAL HISTORY: Past Medical History:  Diagnosis Date  . Essential hypertension   . Heartburn   . Low back pain   . Morbid obesity (HCC)   . Obstructive sleep apnea     PAST SURGICAL HISTORY: Past Surgical History:  Procedure Laterality Date  . HERNIA REPAIR      SOCIAL HISTORY: Social History   Tobacco Use  . Smoking status: Current Every Day Smoker  . Smokeless tobacco: Never Used  Substance Use Topics  . Alcohol use: Yes    Comment: occasional  . Drug use: Never    FAMILY HISTORY: Family History  Problem Relation Age of Onset  . Hypertension Maternal Grandmother   . Hypertension Mother   . Thyroid disease Mother   . Obesity Mother   . Hypertension Father   . Obesity Father   . Diabetes Brother   . Stroke Neg Hx   . Hyperlipidemia Neg Hx   . Heart disease Neg Hx     ROS: Review of Systems  Constitutional: Positive for malaise/fatigue and weight loss.  Gastrointestinal: Negative for nausea and vomiting.  Musculoskeletal:       Negative muscle weakness  Endo/Heme/Allergies:       Negative hypoglycemia    PHYSICAL EXAM: Blood pressure 135/82, pulse 94, temperature 98.2 F (36.8 C), temperature source Oral, height 5\' 10"  (1.778 m), weight (!) 358 lb (162.4 kg), SpO2 99 %. Body mass index is 51.37  kg/m. Physical Exam Vitals signs reviewed.  Constitutional:      Appearance: Normal appearance. He is obese.  Cardiovascular:     Rate and Rhythm: Normal rate.     Pulses: Normal pulses.  Pulmonary:     Effort: Pulmonary effort is normal.     Breath sounds: Normal breath sounds.  Musculoskeletal: Normal range of motion.  Skin:    General: Skin is warm and dry.  Neurological:     Mental Status: He is alert and oriented to person, place, and time.  Psychiatric:        Mood and Affect: Mood normal.        Behavior: Behavior normal.     RECENT LABS AND TESTS: BMET    Component Value Date/Time   NA 142 12/21/2018 1114   K 4.5 12/21/2018 1114   CL 102 12/21/2018 1114   CO2 23 12/21/2018 1114   GLUCOSE 90 12/21/2018 1114   BUN 11 12/21/2018 1114  CREATININE 1.11 12/21/2018 1114   CALCIUM 9.0 12/21/2018 1114   GFRNONAA 86 12/21/2018 1114   GFRAA 100 12/21/2018 1114   Lab Results  Component Value Date   HGBA1C 5.7 (H) 12/21/2018   Lab Results  Component Value Date   INSULIN 17.0 12/22/2018   CBC    Component Value Date/Time   WBC 7.7 12/21/2018 1114   RBC 5.28 12/21/2018 1114   HGB 15.8 12/21/2018 1114   HCT 45.6 12/21/2018 1114   PLT 285 12/21/2018 1114   MCV 86 12/21/2018 1114   MCH 29.9 12/21/2018 1114   MCHC 34.6 12/21/2018 1114   RDW 13.5 12/21/2018 1114   Iron/TIBC/Ferritin/ %Sat No results found for: IRON, TIBC, FERRITIN, IRONPCTSAT Lipid Panel     Component Value Date/Time   CHOL 198 12/21/2018 1114   TRIG 73 12/21/2018 1114   HDL 41 12/21/2018 1114   CHOLHDL 4.8 12/21/2018 1114   LDLCALC 144 (H) 12/21/2018 1114   Hepatic Function Panel     Component Value Date/Time   PROT 6.8 12/21/2018 1114   ALBUMIN 4.2 12/21/2018 1114   AST 22 12/21/2018 1114   ALT 31 12/21/2018 1114   ALKPHOS 99 12/21/2018 1114   BILITOT <0.2 12/21/2018 1114      Component Value Date/Time   TSH 0.935 12/22/2018 1220      OBESITY BEHAVIORAL INTERVENTION VISIT   Today's visit was # 4   Starting weight: 369 lbs Starting date: 12/22/2018 Today's weight : 358 lbs Today's date: 02/02/2019 Total lbs lost to date: 11    ASK: We discussed the diagnosis of obesity with Hardie Pulley today and Belenda Cruise agreed to give [place] permission to discuss obesity behavioral modification therapy today.  ASSESS: Miranda has the diagnosis of obesity and his BMI today is 51.37 Faye is in the action stage of change   ADVISE: Jemari was educated on the multiple health risks of obesity as well as the benefit of weight loss to improve his health. He was advised of the need for long term treatment and the importance of lifestyle modifications to improve his current health and to decrease his risk of future health problems.  AGREE: Multiple dietary modification options and treatment options were discussed and  Rohit agreed to follow the recommendations documented in the above note.  ARRANGE: Dat was educated on the importance of frequent visits to treat obesity as outlined per CMS and USPSTF guidelines and agreed to schedule his next follow up appointment today.  I, Trixie Dredge, am acting as transcriptionist for Ilene Qua, MD  I have reviewed the above documentation for accuracy and completeness, and I agree with the above. - Ilene Qua, MD

## 2019-02-11 ENCOUNTER — Other Ambulatory Visit: Payer: Self-pay | Admitting: Family Medicine

## 2019-02-11 NOTE — Telephone Encounter (Signed)
Please refill for PCE patient if appropriate request.

## 2019-02-23 ENCOUNTER — Ambulatory Visit (INDEPENDENT_AMBULATORY_CARE_PROVIDER_SITE_OTHER): Payer: BC Managed Care – PPO | Admitting: Physician Assistant

## 2019-02-23 ENCOUNTER — Other Ambulatory Visit: Payer: Self-pay | Admitting: Family Medicine

## 2019-02-23 NOTE — Telephone Encounter (Signed)
1) Medication(s) Requested (by name): meloxicam (MOBIC) 15 MG tablet [[id number]]   2) Pharmacy of Choice:  [id number] - [address], [address]   Approved medications will be sent to pharmacy, we will reach out to you if there is an issue.  Requests made after 3pm may not be addressed until following business day!

## 2019-02-24 ENCOUNTER — Ambulatory Visit (INDEPENDENT_AMBULATORY_CARE_PROVIDER_SITE_OTHER): Payer: BC Managed Care – PPO | Admitting: Physician Assistant

## 2019-02-24 ENCOUNTER — Other Ambulatory Visit: Payer: Self-pay

## 2019-02-24 ENCOUNTER — Encounter (INDEPENDENT_AMBULATORY_CARE_PROVIDER_SITE_OTHER): Payer: Self-pay | Admitting: Physician Assistant

## 2019-02-24 VITALS — BP 128/84 | HR 90 | Temp 98.0°F | Ht 70.0 in | Wt 352.0 lb

## 2019-02-24 DIAGNOSIS — R7303 Prediabetes: Secondary | ICD-10-CM | POA: Diagnosis not present

## 2019-02-24 DIAGNOSIS — Z6841 Body Mass Index (BMI) 40.0 and over, adult: Secondary | ICD-10-CM | POA: Diagnosis not present

## 2019-02-24 MED ORDER — MELOXICAM 15 MG PO TABS: 15.0000 mg | ORAL_TABLET | Freq: Every day | ORAL | 4 refills | Status: DC

## 2019-02-28 NOTE — Progress Notes (Signed)
Office: [phone]  /  Fax: [phone]   HPI:   Chief Complaint: OBESITY Jacob Roberts is here to discuss his progress with his obesity treatment plan. He is on the Category 4 plan and is following his eating plan approximately 75 % of the time. He states he is exercising 0 minutes 0 times per week. Jacob Roberts reports that he has a hard time finding the time to get all of his calories in. He is not eating much protein for breakfast. He is mainly eating oatmeal and a granola bar. Every once in a while, he will skip lunch. His weight is (!) 352 lb (159.7 kg) today and has had a weight loss of 6 pounds over a period of 3 weeks since his last visit. He has lost 17 lbs since starting treatment with [place].  Pre-Diabetes Jacob Roberts has a diagnosis of prediabetes based on his elevated Hgb A1c and was informed this puts him at greater risk of developing diabetes. Jacob Roberts is not taking medications currently and he continues to work on diet and exercise to decrease risk of diabetes. He denies polyphagia.  ASSESSMENT AND PLAN:  Prediabetes  Class 3 severe obesity with serious comorbidity and body mass index (BMI) of 50.0 to 59.9 in adult, unspecified obesity type (HCC)  PLAN:  Pre-Diabetes Jacob Roberts will continue to work on weight loss, exercise, and decreasing simple carbohydrates in his diet to help decrease the risk of diabetes. He was informed that eating too many simple carbohydrates or too many calories at one sitting increases the likelihood of GI side effects. Neel agreed to follow up with [place] as directed to monitor his progress.  I spent > than 50% of the 25 minute visit on counseling as documented in the note.  Obesity [name] is currently in the action stage of change. As such, his goal is to continue with weight loss efforts He has agreed to follow the Category 4 plan Jacob Roberts has been instructed to work up to a goal of 150 minutes of combined cardio and strengthening exercise per week for  weight loss and overall health benefits. We discussed the following Behavioral Modification Strategies today: increasing lean protein intake and no skipping meals  Jacob Roberts has agreed to follow up with our clinic in 2 to 3 weeks. He was informed of the importance of frequent follow up visits to maximize his success with intensive lifestyle modifications for his multiple health conditions.  ALLERGIES: Allergies  Allergen Reactions   Penicillins Hives   Sulfa Antibiotics Shortness Of Breath    MEDICATIONS: Current Outpatient Medications on File Prior to Visit  Medication Sig Dispense Refill   cyclobenzaprine (FLEXERIL) 5 MG tablet TAKE 1 TABLET (5 MG TOTAL) BY MOUTH 2 (TWO) TIMES DAILY AS NEEDED FOR MUSCLE SPASMS. 30 tablet 1   meloxicam (MOBIC) 15 MG tablet Take 1 tablet (15 mg total) by mouth daily. 30 tablet 4   Vitamin D, Ergocalciferol, (DRISDOL) 1.25 MG (50000 UT) CAPS capsule Take 1 capsule (50,000 Units total) by mouth every 7 (seven) days. 4 capsule 0   No current facility-administered medications on file prior to visit.     PAST MEDICAL HISTORY: Past Medical History:  Diagnosis Date   Essential hypertension    Heartburn    Low back pain    Morbid obesity (HCC)    Obstructive sleep apnea     PAST SURGICAL HISTORY: Past Surgical History:  Procedure Laterality Date   HERNIA REPAIR      SOCIAL HISTORY: Social History  Tobacco Use   Smoking status: Current Every Day Smoker   Smokeless tobacco: Never Used  Substance Use Topics   Alcohol use: Yes    Comment: occasional   Drug use: Never    FAMILY HISTORY: Family History  Problem Relation Age of Onset   Hypertension Maternal Grandmother    Hypertension Mother    Thyroid disease Mother    Obesity Mother    Hypertension Father    Obesity Father    Diabetes Brother    Stroke Neg Hx    Hyperlipidemia Neg Hx    Heart disease Neg Hx     ROS: Review of Systems  Constitutional:  Positive for weight loss.  Endo/Heme/Allergies:       Negative for polyphagia    PHYSICAL EXAM: Blood pressure 128/84, pulse 90, temperature 98 F (36.7 C), temperature source Oral, height 5\' 10"  (1.778 m), weight (!) 352 lb (159.7 kg), SpO2 99 %. Body mass index is 50.51 kg/m. Physical Exam Vitals signs reviewed.  Constitutional:      Appearance: Normal appearance. He is well-developed. He is obese.  Cardiovascular:     Rate and Rhythm: Normal rate.  Pulmonary:     Effort: Pulmonary effort is normal.  Musculoskeletal: Normal range of motion.  Skin:    General: Skin is warm and dry.  Neurological:     Mental Status: He is alert and oriented to person, place, and time.  Psychiatric:        Mood and Affect: Mood normal.        Behavior: Behavior normal.     RECENT LABS AND TESTS: BMET    Component Value Date/Time   NA 142 12/21/2018 1114   K 4.5 12/21/2018 1114   CL 102 12/21/2018 1114   CO2 23 12/21/2018 1114   GLUCOSE 90 12/21/2018 1114   BUN 11 12/21/2018 1114   CREATININE 1.11 12/21/2018 1114   CALCIUM 9.0 12/21/2018 1114   GFRNONAA 86 12/21/2018 1114   GFRAA 100 12/21/2018 1114   Lab Results  Component Value Date   HGBA1C 5.7 (H) 12/21/2018   Lab Results  Component Value Date   INSULIN 17.0 12/22/2018   CBC    Component Value Date/Time   WBC 7.7 12/21/2018 1114   RBC 5.28 12/21/2018 1114   HGB 15.8 12/21/2018 1114   HCT 45.6 12/21/2018 1114   PLT 285 12/21/2018 1114   MCV 86 12/21/2018 1114   MCH 29.9 12/21/2018 1114   MCHC 34.6 12/21/2018 1114   RDW 13.5 12/21/2018 1114   Iron/TIBC/Ferritin/ %Sat No results found for: IRON, TIBC, FERRITIN, IRONPCTSAT Lipid Panel     Component Value Date/Time   CHOL 198 12/21/2018 1114   TRIG 73 12/21/2018 1114   HDL 41 12/21/2018 1114   CHOLHDL 4.8 12/21/2018 1114   LDLCALC 144 (H) 12/21/2018 1114   Hepatic Function Panel     Component Value Date/Time   PROT 6.8 12/21/2018 1114   ALBUMIN 4.2  12/21/2018 1114   AST 22 12/21/2018 1114   ALT 31 12/21/2018 1114   ALKPHOS 99 12/21/2018 1114   BILITOT <0.2 12/21/2018 1114      Component Value Date/Time   TSH 0.935 12/22/2018 1220     Ref. Range 12/22/2018 12:20  Vitamin D, 25-Hydroxy Latest Ref Range: 30.0 - 100.0 ng/mL 14.2 (L)    OBESITY BEHAVIORAL INTERVENTION VISIT  Today's visit was # 5   Starting weight: 369 lbs Starting date: 12/22/2018 Today's weight : 352 lbs Today's date: 02/24/2019  Total lbs lost to date: 17    02/24/2019  Height 5\' 10"  (1.778 m)  Weight 352 lb (159.7 kg) (A)  BMI (Calculated) 50.51  BLOOD PRESSURE - SYSTOLIC 093  BLOOD PRESSURE - DIASTOLIC 84   Body Fat % 81.8 %  Total Body Water (lbs) 151.4 lbs    ASK: We discussed the diagnosis of obesity with Hardie Pulley today and Belenda Cruise agreed to give [place] permission to discuss obesity behavioral modification therapy today.  ASSESS: Anel has the diagnosis of obesity and his BMI today is 50.51 Roberto is in the action stage of change   ADVISE: Alric was educated on the multiple health risks of obesity as well as the benefit of weight loss to improve his health. He was advised of the need for long term treatment and the importance of lifestyle modifications to improve his current health and to decrease his risk of future health problems.  AGREE: Multiple dietary modification options and treatment options were discussed and  Johny agreed to follow the recommendations documented in the above note.  ARRANGE: Warnell was educated on the importance of frequent visits to treat obesity as outlined per CMS and USPSTF guidelines and agreed to schedule his next follow up appointment today.  Corey Skains, am acting as transcriptionist for Abby Potash, PA-C I, Abby Potash, PA-C have reviewed above note and agree with its content

## 2019-03-07 ENCOUNTER — Other Ambulatory Visit (INDEPENDENT_AMBULATORY_CARE_PROVIDER_SITE_OTHER): Payer: Self-pay | Admitting: Family Medicine

## 2019-03-07 DIAGNOSIS — E559 Vitamin D deficiency, unspecified: Secondary | ICD-10-CM

## 2019-03-08 ENCOUNTER — Encounter: Payer: Self-pay | Admitting: Emergency Medicine

## 2019-03-08 ENCOUNTER — Other Ambulatory Visit: Payer: Self-pay

## 2019-03-08 ENCOUNTER — Ambulatory Visit
Admission: EM | Admit: 2019-03-08 | Discharge: 2019-03-08 | Disposition: A | Discharge status: Home / Self Care | Payer: BC Managed Care – PPO | Attending: Physician Assistant | Admitting: Physician Assistant

## 2019-03-08 DIAGNOSIS — K6289 Other specified diseases of anus and rectum: Secondary | ICD-10-CM | POA: Diagnosis not present

## 2019-03-08 MED ORDER — CIPROFLOXACIN HCL 500 MG PO TABS: 500.0000 mg | ORAL_TABLET | Freq: Two times a day (BID) | ORAL | 0 refills | Status: DC

## 2019-03-08 MED ORDER — HYDROCORTISONE (PERIANAL) 2.5 % EX CREA: 1.0000 "application " | TOPICAL_CREAM | Freq: Two times a day (BID) | CUTANEOUS | 0 refills | Status: DC

## 2019-03-08 MED ORDER — METRONIDAZOLE 500 MG PO TABS: 500.0000 mg | ORAL_TABLET | Freq: Two times a day (BID) | ORAL | 0 refills | Status: DC

## 2019-03-08 NOTE — ED Triage Notes (Signed)
Pt presents to [facility] after a hard bowel movement on Saturday, c/o pain since during BM.  Pt states he tried a hemorrhoid cream and suppository with some help, but states the next bowel movement he had was very painful.  Patient states he has tried water and leafy greens and trying to loosen his stools more without success.  Pt states it feels like he may have "pulled a muscle" because he also feels pain down his right leg.

## 2019-03-08 NOTE — Discharge Instructions (Addendum)
As discussed, cannot rule out infection.  Start ciprofloxacin and Flagyl as directed.  Please do not drink any alcohol while on Flagyl, as this can cause liver problems.  Anusol as directed.  Please start over-the-counter MiraLAX, mineral oil suppository/enema to help with current symptoms.  You can add on over-the-counter Colace if needed. Keep hydrated, urine should be clear to pale yellow in color.  If worsening rectal pain, please go to the emergency department for further evaluation.

## 2019-03-09 ENCOUNTER — Telehealth: Payer: Self-pay | Admitting: Physician Assistant

## 2019-03-09 ENCOUNTER — Telehealth: Payer: Self-pay

## 2019-03-09 MED ORDER — LIDOCAINE VISCOUS HCL 2 % MT SOLN: OROMUCOSAL | 0 refills | Status: DC

## 2019-03-09 NOTE — ED Provider Notes (Signed)
[name]    CSN: [id number] Arrival date & time: 03/08/19  1910      History   Chief Complaint No chief complaint on file.   HPI Jacob Roberts is a 33 y.o. male.   33 year old male comes in for 3-day history of rectal pain.  States has had constipation with hard bowel movements, and since then has had rectal pain during bowel movements.  He has tried hemorrhoid cream, suppository, increasing fiber/fluid intake without relief of hard stools.  He denies abdominal pain, nausea, vomiting.  Rectal pain not present without attempt for bowel movement.  Denies rectal bleeding.  Denies fever, chills, body aches.     Past Medical History:  Diagnosis Date   Essential hypertension    Heartburn    Low back pain    Morbid obesity ([place])    Obstructive sleep apnea     Patient Active Problem List   Diagnosis Date Noted   Elevated blood pressure reading 12/21/2018   Light smoker 12/21/2018   Morbid obesity ([place]) 12/21/2018   OSA (obstructive sleep apnea) 12/21/2018   Body mass index 50.0-59.9, adult ([place]) 08/15/2018   Lumbar radiculopathy 07/04/2018   Encounter to establish care 07/04/2018    Past Surgical History:  Procedure Laterality Date   HERNIA REPAIR         Home Medications    Prior to Admission medications   Medication Sig Start Date End Date Taking? Authorizing Provider  ciprofloxacin (CIPRO) 500 MG tablet Take 1 tablet (500 mg total) by mouth every 12 (twelve) hours. 03/08/19   Tasia Catchings, Madeeha Costantino V, PA-C  cyclobenzaprine (FLEXERIL) 5 MG tablet TAKE 1 TABLET (5 MG TOTAL) BY MOUTH 2 (TWO) TIMES DAILY AS NEEDED FOR MUSCLE SPASMS. 02/11/19   Charlott Rakes, MD  hydrocortisone (ANUSOL-HC) 2.5 % rectal cream Place 1 application rectally 2 (two) times daily. 03/08/19   Tasia Catchings, Keyri Salberg V, PA-C  meloxicam (MOBIC) 15 MG tablet Take 1 tablet (15 mg total) by mouth daily. 02/24/19   Ladell Pier, MD  metroNIDAZOLE (FLAGYL) 500 MG tablet Take 1 tablet (500 mg  total) by mouth 2 (two) times daily. 03/08/19   Tasia Catchings, Symphonie Schneiderman V, PA-C  Vitamin D, Ergocalciferol, (DRISDOL) 1.25 MG (50000 UT) CAPS capsule Take 1 capsule (50,000 Units total) by mouth every 7 (seven) days. 02/02/19   Eber Jones, MD    Family History Family History  Problem Relation Age of Onset   Hypertension Maternal Grandmother    Hypertension Mother    Thyroid disease Mother    Obesity Mother    Hypertension Father    Obesity Father    Diabetes Brother    Stroke Neg Hx    Hyperlipidemia Neg Hx    Heart disease Neg Hx     Social History Social History   Tobacco Use   Smoking status: Current Some Day Smoker   Smokeless tobacco: Never Used  Substance Use Topics   Alcohol use: Yes    Comment: occasional   Drug use: Never     Allergies   Penicillins and Sulfa antibiotics   Review of Systems Review of Systems  Reason unable to perform ROS: See HPI as above.     Physical Exam Triage Vital Signs ED Triage Vitals  Enc Vitals Group     BP --      Pulse --      Resp --      Temp 03/08/19 1918 98.9 F (37.2 C)     Temp Source  03/08/19 1918 Oral     SpO2 --      Weight --      Height --      Head Circumference --      Peak Flow --      Pain Score 03/08/19 1919 10     Pain Loc --      Pain Edu? --      Excl. in GC? --    No data found.  Updated Vital Signs BP (!) 152/87 (BP Location: Right Arm)    Pulse 88    Temp 98.9 F (37.2 C) (Oral)    Resp 18    SpO2 98%   Physical Exam Exam conducted with a chaperone present.  Constitutional:      General: He is not in acute distress.    Appearance: He is well-developed. He is not diaphoretic.  HENT:     Head: Normocephalic and atraumatic.  Eyes:     Conjunctiva/sclera: Conjunctivae normal.     Pupils: Pupils are equal, round, and reactive to light.  Pulmonary:     Effort: Pulmonary effort is normal. No respiratory distress.  Genitourinary:    Comments: No external hemorrhoids  visualized.  No tenderness to palpation perirectally.  Digital exam with tenderness to 3:00 region.  No masses felt.  No warmth, erythema, fluctuance. Skin:    General: Skin is warm and dry.  Neurological:     Mental Status: He is alert and oriented to person, place, and time.      UC Treatments / Results  Labs (all labs ordered are listed, but only abnormal results are displayed) Labs Reviewed - No data to display  EKG   Radiology No results found.  Procedures Procedures (including critical care time)  Medications Ordered in UC Medications - No data to display  Initial Impression / Assessment and Plan / UC Course  I have reviewed the triage vital signs and the nursing notes.  Pertinent labs & imaging results that were available during my care of the patient were reviewed by me and considered in my medical decision making (see chart for details).    History and exam are consistent with anal fissure, though not seen visually.  However, although no obvious perirectal cellulitis/abscess, given pain, would like to cover for infection.  Patient is allergic to penicillins, will avoid Augmentin and have patient start ciprofloxacin and Flagyl.  Other symptomatic treatment discussed.  Strict return precautions given.  Patient expresses understanding and agrees to plan.  Final Clinical Impressions(s) / UC Diagnoses   Final diagnoses:  Rectal pain   ED Prescriptions    Medication Sig Dispense Auth. Provider   ciprofloxacin (CIPRO) 500 MG tablet Take 1 tablet (500 mg total) by mouth every 12 (twelve) hours. 10 tablet Asees Manfredi V, PA-C   metroNIDAZOLE (FLAGYL) 500 MG tablet Take 1 tablet (500 mg total) by mouth 2 (two) times daily. 10 tablet Tabby Beaston V, PA-C   hydrocortisone (ANUSOL-HC) 2.5 % rectal cream Place 1 application rectally 2 (two) times daily. 30 g Belinda Fisher, PA-C     PDMP not reviewed this encounter.   Belinda Fisher, PA-C 03/09/19 [phone]

## 2019-03-09 NOTE — Telephone Encounter (Signed)
Patient called back, with some improvement of symptoms. Would like to try some pain medicine for anal fissure. Lidocaine viscous called into pharmacy. Patient states worries for pain with BM. Will try prior to BM. Will also pick up enema/suppository to help with constipation. Return precautions given again. Patient expresses understanding and agrees to plan.

## 2019-03-16 ENCOUNTER — Other Ambulatory Visit: Payer: Self-pay | Admitting: Family Medicine

## 2019-03-21 ENCOUNTER — Other Ambulatory Visit: Payer: Self-pay

## 2019-03-21 ENCOUNTER — Ambulatory Visit (INDEPENDENT_AMBULATORY_CARE_PROVIDER_SITE_OTHER): Payer: BC Managed Care – PPO | Admitting: Physician Assistant

## 2019-03-21 ENCOUNTER — Encounter (INDEPENDENT_AMBULATORY_CARE_PROVIDER_SITE_OTHER): Payer: Self-pay | Admitting: Physician Assistant

## 2019-03-21 VITALS — BP 132/84 | HR 88 | Temp 98.4°F | Ht 70.0 in | Wt 348.0 lb

## 2019-03-21 DIAGNOSIS — Z6841 Body Mass Index (BMI) 40.0 and over, adult: Secondary | ICD-10-CM

## 2019-03-21 DIAGNOSIS — E559 Vitamin D deficiency, unspecified: Secondary | ICD-10-CM | POA: Diagnosis not present

## 2019-03-22 ENCOUNTER — Ambulatory Visit: Payer: BC Managed Care – PPO

## 2019-03-22 NOTE — Progress Notes (Signed)
Office: [phone]  /  Fax: [phone]   HPI:   Chief Complaint: OBESITY Amour is here to discuss his progress with his obesity treatment plan. He is on the Category 4 plan and is following his eating plan approximately 30-40 % of the time. He states he is exercising 0 minutes 0 times per week. Myrle reports doing a better job with meal planning and not skipping meals. His wife is in the program as well and is supportive.  His weight is (!) 348 lb (157.9 kg) today and has had a weight loss of 4 pounds over a period of 3 to 4 weeks since his last visit. He has lost 21 lbs since starting treatment with [place].  Vitamin D Deficiency Ramy has a diagnosis of vitamin D deficiency. He is currently taking prescription Vit D and denies nausea, vomiting or muscle weakness.  ASSESSMENT AND PLAN:  Vitamin D deficiency  Class 3 severe obesity with serious comorbidity and body mass index (BMI) of 50.0 to 59.9 in adult, unspecified obesity type ([place])  PLAN:  Vitamin D Deficiency Diar was informed that low vitamin D levels contributes to fatigue and are associated with obesity, breast, and colon cancer. Neely agrees to continue taking prescription Vit D 50,000 IU every week and will follow up for routine testing of vitamin D, at least 2-3 times per year. He was informed of the risk of over-replacement of vitamin D and agrees to not increase his dose unless he discusses this with [place] first. Oseph agrees to follow up with our clinic in 2 to 3 weeks.  I spent > than 50% of the 15 minute visit on counseling as documented in the note.  Obesity Jasn is currently in the action stage of change. As such, his goal is to continue with weight loss efforts He has agreed to follow the Category 4 plan Britton has been instructed to work up to a goal of 150 minutes of combined cardio and strengthening exercise per week for weight loss and overall health benefits. We discussed the following  Behavioral Modification Strategies today: work on meal planning and easy cooking plans and keeping healthy foods in the home   Kemauri has agreed to follow up with our clinic in 2 to 3 weeks. He was informed of the importance of frequent follow up visits to maximize his success with intensive lifestyle modifications for his multiple health conditions.  ALLERGIES: Allergies  Allergen Reactions  . Penicillins Hives  . Sulfa Antibiotics Shortness Of Breath    MEDICATIONS: Current Outpatient Medications on File Prior to Visit  Medication Sig Dispense Refill  . ciprofloxacin (CIPRO) 500 MG tablet Take 1 tablet (500 mg total) by mouth every 12 (twelve) hours. 10 tablet 0  . cyclobenzaprine (FLEXERIL) 5 MG tablet Take 1 tablet (5 mg total) by mouth 2 (two) times daily as needed for muscle spasms. Please keep upcoming appointment for more refills. 30 tablet 0  . hydrocortisone (ANUSOL-HC) 2.5 % rectal cream Place 1 application rectally 2 (two) times daily. 30 g 0  . lidocaine (XYLOCAINE) 2 % solution Pour 5-15 mL on gauze or cotton ball to soak on painful area as needed 150 mL 0  . meloxicam (MOBIC) 15 MG tablet Take 1 tablet (15 mg total) by mouth daily. 30 tablet 4  . metroNIDAZOLE (FLAGYL) 500 MG tablet Take 1 tablet (500 mg total) by mouth 2 (two) times daily. 10 tablet 0  . Vitamin D, Ergocalciferol, (DRISDOL) 1.25 MG (50000 UT) CAPS capsule  Take 1 capsule (50,000 Units total) by mouth every 7 (seven) days. 4 capsule 0   No current facility-administered medications on file prior to visit.     PAST MEDICAL HISTORY: Past Medical History:  Diagnosis Date  . Essential hypertension   . Heartburn   . Low back pain   . Morbid obesity (HCC)   . Obstructive sleep apnea     PAST SURGICAL HISTORY: Past Surgical History:  Procedure Laterality Date  . HERNIA REPAIR      SOCIAL HISTORY: Social History   Tobacco Use  . Smoking status: Current Some Day Smoker  . Smokeless tobacco: Never  Used  Substance Use Topics  . Alcohol use: Yes    Comment: occasional  . Drug use: Never    FAMILY HISTORY: Family History  Problem Relation Age of Onset  . Hypertension Maternal Grandmother   . Hypertension Mother   . Thyroid disease Mother   . Obesity Mother   . Hypertension Father   . Obesity Father   . Diabetes Brother   . Stroke Neg Hx   . Hyperlipidemia Neg Hx   . Heart disease Neg Hx     ROS: Review of Systems  Constitutional: Positive for weight loss.  Gastrointestinal: Negative for nausea and vomiting.  Musculoskeletal:       Negative muscle weakness    PHYSICAL EXAM: Blood pressure 132/84, pulse 88, temperature 98.4 F (36.9 C), temperature source Oral, height 5\' 10"  (1.778 m), weight (!) 348 lb (157.9 kg), SpO2 99 %. Body mass index is 49.93 kg/m. Physical Exam Vitals signs reviewed.  Constitutional:      Appearance: Normal appearance. He is obese.  Cardiovascular:     Rate and Rhythm: Normal rate.     Pulses: Normal pulses.  Pulmonary:     Effort: Pulmonary effort is normal.     Breath sounds: Normal breath sounds.  Musculoskeletal: Normal range of motion.  Skin:    General: Skin is warm and dry.  Neurological:     Mental Status: He is alert and oriented to person, place, and time.  Psychiatric:        Mood and Affect: Mood normal.        Behavior: Behavior normal.     RECENT LABS AND TESTS: BMET    Component Value Date/Time   NA 142 12/21/2018 1114   K 4.5 12/21/2018 1114   CL 102 12/21/2018 1114   CO2 23 12/21/2018 1114   GLUCOSE 90 12/21/2018 1114   BUN 11 12/21/2018 1114   CREATININE 1.11 12/21/2018 1114   CALCIUM 9.0 12/21/2018 1114   GFRNONAA 86 12/21/2018 1114   GFRAA 100 12/21/2018 1114   Lab Results  Component Value Date   HGBA1C 5.7 (H) 12/21/2018   Lab Results  Component Value Date   INSULIN 17.0 12/22/2018   CBC    Component Value Date/Time   WBC 7.7 12/21/2018 1114   RBC 5.28 12/21/2018 1114   HGB 15.8  12/21/2018 1114   HCT 45.6 12/21/2018 1114   PLT 285 12/21/2018 1114   MCV 86 12/21/2018 1114   MCH 29.9 12/21/2018 1114   MCHC 34.6 12/21/2018 1114   RDW 13.5 12/21/2018 1114   Iron/TIBC/Ferritin/ %Sat No results found for: IRON, TIBC, FERRITIN, IRONPCTSAT Lipid Panel     Component Value Date/Time   CHOL 198 12/21/2018 1114   TRIG 73 12/21/2018 1114   HDL 41 12/21/2018 1114   CHOLHDL 4.8 12/21/2018 1114   LDLCALC 144 (H) 12/21/2018  1114   Hepatic Function Panel     Component Value Date/Time   PROT 6.8 12/21/2018 1114   ALBUMIN 4.2 12/21/2018 1114   AST 22 12/21/2018 1114   ALT 31 12/21/2018 1114   ALKPHOS 99 12/21/2018 1114   BILITOT <0.2 12/21/2018 1114      Component Value Date/Time   TSH 0.935 12/22/2018 1220      OBESITY BEHAVIORAL INTERVENTION VISIT  Today's visit was # 6   Starting weight: 369 lbs Starting date: 12/22/2018 Today's weight : 348 lbs Today's date: 03/21/2019 Total lbs lost to date: 21    ASK: We discussed the diagnosis of obesity with Lytle MichaelsGregory Simonetti today and Earl LitesGregory agreed to give us permission to discuss obesity behavioral modification therapy today.  ASSESS: Earl LitesGregory has the diagnosis of obesity and his BMI today is 49.93 Earl LitesGregory is in the action stage of change   ADVISE: Earl LitesGregory was educated on the multiple health risks of obesity as well as the benefit of weight loss to improve his health. He was advised of the need for long term treatment and the importance of lifestyle modifications to improve his current health and to decrease his risk of future health problems.  AGREE: Multiple dietary modification options and treatment options were discussed and  Earl LitesGregory agreed to follow the recommendations documented in the above note.  ARRANGE: Earl LitesGregory was educated on the importance of frequent visits to treat obesity as outlined per CMS and USPSTF guidelines and agreed to schedule his next follow up appointment today.  Trude McburneyI, Alexus Michael,  am acting as transcriptionist for Alois Clicheracey Aguilar, PA-C I, Alois Clicheracey Aguilar, PA-C have reviewed above note and agree with its content

## 2019-03-24 ENCOUNTER — Telehealth: Payer: Self-pay | Admitting: Family Medicine

## 2019-03-24 MED ORDER — MECLIZINE HCL 25 MG PO TABS: 25.0000 mg | ORAL_TABLET | Freq: Three times a day (TID) | ORAL | 0 refills | Status: DC | PRN

## 2019-03-24 NOTE — Telephone Encounter (Signed)
Pt states he will be deep sea fishing in the near future and would like a prescription for motion sickness, please advise   [facility]acy [facility] choice[id number] [place], [place]

## 2019-03-25 NOTE — Telephone Encounter (Signed)
Patient notified

## 2019-04-05 ENCOUNTER — Other Ambulatory Visit: Payer: Self-pay

## 2019-04-05 ENCOUNTER — Ambulatory Visit (INDEPENDENT_AMBULATORY_CARE_PROVIDER_SITE_OTHER): Payer: BC Managed Care – PPO | Admitting: Physician Assistant

## 2019-04-05 ENCOUNTER — Encounter (INDEPENDENT_AMBULATORY_CARE_PROVIDER_SITE_OTHER): Payer: Self-pay | Admitting: Physician Assistant

## 2019-04-05 VITALS — BP 145/84 | HR 89 | Temp 98.2°F | Ht 70.0 in | Wt 353.0 lb

## 2019-04-05 DIAGNOSIS — R7303 Prediabetes: Secondary | ICD-10-CM | POA: Diagnosis not present

## 2019-04-05 DIAGNOSIS — Z6841 Body Mass Index (BMI) 40.0 and over, adult: Secondary | ICD-10-CM

## 2019-04-06 ENCOUNTER — Other Ambulatory Visit: Payer: Self-pay | Admitting: Family Medicine

## 2019-04-06 MED ORDER — CYCLOBENZAPRINE HCL 5 MG PO TABS: 5.0000 mg | ORAL_TABLET | Freq: Two times a day (BID) | ORAL | 0 refills | Status: DC | PRN

## 2019-04-06 NOTE — Telephone Encounter (Signed)
Had CPE on 12/21/2018. Upcoming appointment 04/19/2019.

## 2019-04-06 NOTE — Telephone Encounter (Signed)
1) Medication(s) Requested (by name): -cyclobenzaprine (FLEXERIL) 5 MG tablet   2) Pharmacy of Choice: -[id number] [place], [place]

## 2019-04-07 NOTE — Progress Notes (Signed)
Office: [phone]  /  Fax: [phone]   HPI:  Chief Complaint: OBESITY Jacob Roberts is here to discuss his progress with his obesity treatment plan. He is on the Category 4 plan and states he is following his eating plan approximately 30 % of the time. He states he is exercising 0 minutes 0 times per week.  Jacob Roberts reports that he has been skipping meals and not getting all of his protein in, due to a crazy work schedule. He is ready to get back on track.  Pre-Diabetes Jacob Roberts has a diagnosis of prediabetes and he is not on medications. He has no polyphagia.     Today's visit was # 7  Starting weight: 369 lbs Starting date: 12/22/2018 Today's weight : 353 lbs  Today's date: 04/05/2019 Total lbs lost to date: 16 Total lbs lost since last in-office visit: 0  ASSESSMENT AND PLAN:  Prediabetes  Class 3 severe obesity with serious comorbidity and body mass index (BMI) of 50.0 to 59.9 in adult, unspecified obesity type (HCC)  PLAN:  Pre-Diabetes Jacob Roberts will continue to work on weight loss, exercise, and decreasing simple carbohydrates to help decrease the risk of diabetes.   Obesity Jacob Roberts is currently in the action stage of change. As such, his goal is to continue with weight loss efforts He has agreed to follow the Category 3 plan Jacob Roberts has been instructed to work up to a goal of 150 minutes of combined cardio and strengthening exercise per week for weight loss and overall health benefits. We discussed the following Behavioral Modification Strategies today: no skipping meals and work on meal planning and easy cooking plans  Jacob Roberts has agreed to follow up with our clinic in 3 weeks. He was informed of the importance of frequent follow up visits to maximize his success with intensive lifestyle modifications for his multiple health conditions.  I spent > than 50% of the 28 minute visit on counseling as documented in the note.    ALLERGIES: Allergies  Allergen Reactions    . Penicillins Hives  . Sulfa Antibiotics Shortness Of Breath    MEDICATIONS: Current Outpatient Medications on File Prior to Visit  Medication Sig Dispense Refill  . hydrocortisone (ANUSOL-HC) 2.5 % rectal cream Place 1 application rectally 2 (two) times daily. 30 g 0  . lidocaine (XYLOCAINE) 2 % solution Pour 5-15 mL on gauze or cotton ball to soak on painful area as needed 150 mL 0  . meclizine (ANTIVERT) 25 MG tablet Take 1 tablet (25 mg total) by mouth 3 (three) times daily as needed (for motion sickness). 20 tablet 0  . meloxicam (MOBIC) 15 MG tablet Take 1 tablet (15 mg total) by mouth daily. 30 tablet 4  . Vitamin D, Ergocalciferol, (DRISDOL) 1.25 MG (50000 UT) CAPS capsule Take 1 capsule (50,000 Units total) by mouth every 7 (seven) days. 4 capsule 0   No current facility-administered medications on file prior to visit.    PAST MEDICAL HISTORY: Past Medical History:  Diagnosis Date  . Essential hypertension   . Heartburn   . Low back pain   . Morbid obesity (HCC)   . Obstructive sleep apnea     PAST SURGICAL HISTORY: Past Surgical History:  Procedure Laterality Date  . HERNIA REPAIR      SOCIAL HISTORY: Social History   Tobacco Use  . Smoking status: Current Some Day Smoker  . Smokeless tobacco: Never Used  Substance Use Topics  . Alcohol use: Yes    Comment: occasional  .  Drug use: Never    FAMILY HISTORY: Family History  Problem Relation Age of Onset  . Hypertension Maternal Grandmother   . Hypertension Mother   . Thyroid disease Mother   . Obesity Mother   . Hypertension Father   . Obesity Father   . Diabetes Brother   . Stroke Neg Hx   . Hyperlipidemia Neg Hx   . Heart disease Neg Hx     ROS: Review of Systems  Constitutional: Negative for weight loss.  Endo/Heme/Allergies:       Negative for polyphagia    PHYSICAL EXAM: Blood pressure (!) 145/84, pulse 89, temperature 98.2 F (36.8 C), temperature source Oral, height 5\' 10"  (1.778 m),  weight (!) 353 lb (160.1 kg), SpO2 99 %. Body mass index is 50.65 kg/m. Physical Exam Vitals reviewed.  Constitutional:      General: He is not in acute distress.    Appearance: Normal appearance. He is well-developed. He is obese.  Cardiovascular:     Rate and Rhythm: Normal rate.  Pulmonary:     Effort: Pulmonary effort is normal.  Musculoskeletal:        General: Normal range of motion.  Skin:    General: Skin is warm and dry.  Neurological:     Mental Status: He is alert and oriented to person, place, and time.  Psychiatric:        Mood and Affect: Mood normal.        Behavior: Behavior normal.     RECENT LABS AND TESTS: BMET    Component Value Date/Time   NA 142 12/21/2018 1114   K 4.5 12/21/2018 1114   CL 102 12/21/2018 1114   CO2 23 12/21/2018 1114   GLUCOSE 90 12/21/2018 1114   BUN 11 12/21/2018 1114   CREATININE 1.11 12/21/2018 1114   CALCIUM 9.0 12/21/2018 1114   GFRNONAA 86 12/21/2018 1114   GFRAA 100 12/21/2018 1114   Lab Results  Component Value Date   HGBA1C 5.7 (H) 12/21/2018   Lab Results  Component Value Date   INSULIN 17.0 12/22/2018   CBC    Component Value Date/Time   WBC 7.7 12/21/2018 1114   RBC 5.28 12/21/2018 1114   HGB 15.8 12/21/2018 1114   HCT 45.6 12/21/2018 1114   PLT 285 12/21/2018 1114   MCV 86 12/21/2018 1114   MCH 29.9 12/21/2018 1114   MCHC 34.6 12/21/2018 1114   RDW 13.5 12/21/2018 1114   Iron/TIBC/Ferritin/ %Sat No results found for: IRON, TIBC, FERRITIN, IRONPCTSAT Lipid Panel     Component Value Date/Time   CHOL 198 12/21/2018 1114   TRIG 73 12/21/2018 1114   HDL 41 12/21/2018 1114   CHOLHDL 4.8 12/21/2018 1114   LDLCALC 144 (H) 12/21/2018 1114   Hepatic Function Panel     Component Value Date/Time   PROT 6.8 12/21/2018 1114   ALBUMIN 4.2 12/21/2018 1114   AST 22 12/21/2018 1114   ALT 31 12/21/2018 1114   ALKPHOS 99 12/21/2018 1114   BILITOT <0.2 12/21/2018 1114      Component Value Date/Time    TSH 0.935 12/22/2018 1220     Ref. Range 12/22/2018 12:20  Vitamin D, 25-Hydroxy Latest Ref Range: 30.0 - 100.0 ng/mL 14.2 (L)   I, Doreene Nest, am acting as transcriptionist for Abby Potash, PA-C I, Abby Potash, PA-C have reviewed above note and agree with its content

## 2019-04-19 ENCOUNTER — Ambulatory Visit: Payer: BC Managed Care – PPO

## 2019-04-26 ENCOUNTER — Other Ambulatory Visit: Payer: Self-pay | Admitting: Internal Medicine

## 2019-04-27 ENCOUNTER — Ambulatory Visit (INDEPENDENT_AMBULATORY_CARE_PROVIDER_SITE_OTHER): Payer: BC Managed Care – PPO | Admitting: Physician Assistant

## 2019-04-28 ENCOUNTER — Institutional Professional Consult (permissible substitution): Payer: BC Managed Care – PPO | Admitting: Neurology

## 2019-05-19 ENCOUNTER — Other Ambulatory Visit: Payer: Self-pay | Admitting: Internal Medicine

## 2019-05-23 ENCOUNTER — Ambulatory Visit (INDEPENDENT_AMBULATORY_CARE_PROVIDER_SITE_OTHER): Payer: BC Managed Care – PPO | Admitting: Physician Assistant

## 2019-05-23 ENCOUNTER — Encounter (INDEPENDENT_AMBULATORY_CARE_PROVIDER_SITE_OTHER): Payer: Self-pay | Admitting: Physician Assistant

## 2019-05-23 ENCOUNTER — Other Ambulatory Visit: Payer: Self-pay

## 2019-05-23 VITALS — BP 150/95 | HR 88 | Temp 98.2°F | Ht 70.0 in | Wt 357.0 lb

## 2019-05-23 DIAGNOSIS — Z6841 Body Mass Index (BMI) 40.0 and over, adult: Secondary | ICD-10-CM | POA: Diagnosis not present

## 2019-05-23 DIAGNOSIS — E559 Vitamin D deficiency, unspecified: Secondary | ICD-10-CM

## 2019-05-23 NOTE — Progress Notes (Signed)
Chief Complaint:   OBESITY Jacob Roberts is here to discuss his progress with his obesity treatment plan along with follow-up of his obesity related diagnoses. Jacob Roberts is on the Category 3 Plan and states he is following his eating plan approximately 50% of the time. Jacob Roberts states he is doing 0 minutes 0 times per week.  Today's visit was #: 8 Starting weight: 369 lbs Starting date: 01/01/2019 Today's weight: 357 lbs Today's date: 05/23/2019 Total lbs lost to date: 12 Total lbs lost since last in-office visit: 0  Interim History: Jacob Roberts reports that his work schedule has negatively impacted his ability to stay on the plan. He is on Category 3 because he does not feel that he can get all of the food in on Category 4.  Subjective:   1. Vitamin D deficiency Jacob Roberts is on Vit D, and he denies nausea, vomiting, or muscle weakness. His last Vit D level was 14.2.  Assessment/Plan:   1. Vitamin D deficiency Low Vitamin D level contributes to fatigue and are associated with obesity, breast, and colon cancer. Jacob Roberts agreed to continue taking prescription Vitamin D and will follow-up for routine testing of Vitamin D, at least 2-3 times per year to avoid over-replacement.  2. Class 3 severe obesity with serious comorbidity and body mass index (BMI) of 50.0 to 59.9 in adult, unspecified obesity type (HCC) Jacob Roberts is currently in the action stage of change. As such, his goal is to continue with weight loss efforts. He has agreed to the Category 4 Plan.   Exercise goals: No exercise has been prescribed at this time.  Behavioral modification strategies: increasing lean protein intake, meal planning and cooking strategies and planning for success.  Jacob Roberts has agreed to follow-up with our clinic in 2 to 3 weeks. He was informed of the importance of frequent follow-up visits to maximize his success with intensive lifestyle modifications for his multiple health conditions.   Objective:   Blood  pressure (!) 150/95, pulse 88, temperature 98.2 F (36.8 C), temperature source Oral, height 5\' 10"  (1.778 m), weight (!) 357 lb (161.9 kg), SpO2 99 %. Body mass index is 51.22 kg/m.  General: Cooperative, alert, well developed, in no acute distress. HEENT: Conjunctivae and lids unremarkable. Cardiovascular: Regular rhythm.  Lungs: Normal work of breathing. Neurologic: No focal deficits.   Lab Results  Component Value Date   CREATININE 1.11 12/21/2018   BUN 11 12/21/2018   NA 142 12/21/2018   K 4.5 12/21/2018   CL 102 12/21/2018   CO2 23 12/21/2018   Lab Results  Component Value Date   ALT 31 12/21/2018   AST 22 12/21/2018   ALKPHOS 99 12/21/2018   BILITOT <0.2 12/21/2018   Lab Results  Component Value Date   HGBA1C 5.7 (H) 12/21/2018   Lab Results  Component Value Date   INSULIN 17.0 12/22/2018   Lab Results  Component Value Date   TSH 0.935 12/22/2018   Lab Results  Component Value Date   CHOL 198 12/21/2018   HDL 41 12/21/2018   LDLCALC 144 (H) 12/21/2018   TRIG 73 12/21/2018   CHOLHDL 4.8 12/21/2018   Lab Results  Component Value Date   WBC 7.7 12/21/2018   HGB 15.8 12/21/2018   HCT 45.6 12/21/2018   MCV 86 12/21/2018   PLT 285 12/21/2018   No results found for: IRON, TIBC, FERRITIN  Attestation Statements:   Reviewed by clinician on day of visit: allergies, medications, problem list, medical history, surgical history,  family history, social history, and previous encounter notes.  Time spent on visit including pre-visit chart review and post-visit care was 26 minutes.   Wilhemena Durie, am acting as transcriptionist for [employer], PA-C.  I have reviewed the above documentation for accuracy and completeness, and I agree with the above. Abby Potash, PA-C

## 2019-05-24 ENCOUNTER — Other Ambulatory Visit: Payer: Self-pay | Admitting: Family Medicine

## 2019-05-24 MED ORDER — CYCLOBENZAPRINE HCL 5 MG PO TABS: 5.0000 mg | ORAL_TABLET | Freq: Two times a day (BID) | ORAL | 0 refills | Status: DC | PRN

## 2019-05-24 NOTE — Telephone Encounter (Signed)
1) Medication(s) Requested (by name): cyclobenzaprine (FLEXERIL) 5 MG tablet [[id number]]   2) [facility]: [id number] [place], [place] - [address]     Approved medications will be sent to pharmacy, we will reach out to you if there is an issue.  Requests made after 3pm may not be addressed until following business day!

## 2019-06-13 ENCOUNTER — Ambulatory Visit (INDEPENDENT_AMBULATORY_CARE_PROVIDER_SITE_OTHER): Payer: BC Managed Care – PPO | Admitting: Physician Assistant

## 2019-06-15 ENCOUNTER — Other Ambulatory Visit (INDEPENDENT_AMBULATORY_CARE_PROVIDER_SITE_OTHER): Payer: Self-pay | Admitting: Family Medicine

## 2019-06-15 ENCOUNTER — Telehealth: Payer: Self-pay | Admitting: Family Medicine

## 2019-06-15 DIAGNOSIS — E559 Vitamin D deficiency, unspecified: Secondary | ICD-10-CM

## 2019-06-15 NOTE — Telephone Encounter (Signed)
1) Medication(s) Requested (by name):meloxicam (MOBIC) 15 MG tablet [[id number]]  cyclobenzaprine (FLEXERIL) 5 MG tablet [[id number]]    2) [phone] [place], [place] - [address]  [address], [place] [address]   3) Special Requests: Please fill enough until appointment on March 15  Approved medications will be sent to the pharmacy, we will reach out if there is an issue.  Requests made after 3pm may not be addressed until the following business day!  If a patient is unsure of the name of the medication(s) please note and ask patient to call back when they are able to provide all info, do not send to responsible party until all information is available!

## 2019-06-17 ENCOUNTER — Other Ambulatory Visit: Payer: Self-pay | Admitting: Internal Medicine

## 2019-06-17 MED ORDER — MELOXICAM 15 MG PO TABS: 15.0000 mg | ORAL_TABLET | Freq: Every day | ORAL | 0 refills | Status: DC

## 2019-06-17 MED ORDER — CYCLOBENZAPRINE HCL 5 MG PO TABS: 5.0000 mg | ORAL_TABLET | Freq: Two times a day (BID) | ORAL | 0 refills | Status: DC | PRN

## 2019-06-17 NOTE — Progress Notes (Signed)
Refill for Flexeril and Mobic sent in.   Marcy Siren, D.O. Primary Care at [facility]  06/17/2019, 10:19 AM

## 2019-07-04 ENCOUNTER — Ambulatory Visit: Payer: BC Managed Care – PPO

## 2019-07-08 ENCOUNTER — Encounter: Payer: Self-pay | Admitting: Family Medicine

## 2019-07-08 ENCOUNTER — Ambulatory Visit (INDEPENDENT_AMBULATORY_CARE_PROVIDER_SITE_OTHER): Payer: BC Managed Care – PPO | Admitting: Family Medicine

## 2019-07-08 DIAGNOSIS — E559 Vitamin D deficiency, unspecified: Secondary | ICD-10-CM

## 2019-07-08 DIAGNOSIS — R7303 Prediabetes: Secondary | ICD-10-CM | POA: Diagnosis not present

## 2019-07-08 DIAGNOSIS — R42 Dizziness and giddiness: Secondary | ICD-10-CM | POA: Diagnosis not present

## 2019-07-08 MED ORDER — MELOXICAM 15 MG PO TABS: 15.0000 mg | ORAL_TABLET | Freq: Every day | ORAL | 2 refills | Status: DC

## 2019-07-08 MED ORDER — MECLIZINE HCL 25 MG PO TABS: 25.0000 mg | ORAL_TABLET | Freq: Three times a day (TID) | ORAL | 2 refills | Status: DC | PRN

## 2019-07-08 MED ORDER — CYCLOBENZAPRINE HCL 5 MG PO TABS: 5.0000 mg | ORAL_TABLET | Freq: Two times a day (BID) | ORAL | 3 refills | Status: DC | PRN

## 2019-07-08 NOTE — Progress Notes (Signed)
DOB and name verified   Requested Flexiril, Meclizine  and Meloxicam meds refill   Still dedicated to loose weight  Weight 354 Lb trying to go 2 to 3 times a week

## 2019-07-08 NOTE — Progress Notes (Signed)
Virtual Visit via Telephone Note  I connected with Jacob Roberts on 07/08/19 at  9:50 AM EDT by telephone and verified that I am speaking with the correct person using two identifiers.  Location: Patient: Home  Provider: Office    I discussed the limitations, risks, security and privacy concerns of performing an evaluation and management service by telephone and the availability of in person appointments. I also discussed with the patient that there may be a patient responsible charge related to this service. The patient expressed understanding and agreed to proceed.  History of Present Illness: Jacob Roberts 34 y.o males is present via today's telemedicine encounter for medication refills and referral follow-up request.  Back Pain/Weight Management/ Medication Refills -patient suffers from chronic back pain which is controlled with anti inflammatory and muscle relaxer. He is morbid obese and has been followed by medical weight loss management through [facility] for more than 6 months without much success with weight loss. He endorses difficulty with follow-up appointments due to work schedule. He did not like meal plan he was prescribed. He is ready to move forward with consulting with a bariatric clinic about surgery as he feel is weight  Is increasing and his overall health is not improving. Requests a refill of meclizine. Patient will be sailing on a fishing boat and endorses medication helped with sea sickness in the past. He denies any other concerns today.  Observations/Objective: Patient is speaking clearly without distress .  Assessment and Plan: 1. Morbid obesity (HCC) - Encouraged efforts to reduce weight include engaging in physical activity as tolerated with goal of 150 minutes per week. Improve dietary choices and eat a meal regimen consistent with a Mediterranean or DASH diet. Reduce simple carbohydrates. Do not skip meals and eat healthy snacks throughout the day to avoid  over-eating at dinner. Set a goal weight loss that is achievable for you.  -Unfortunately medical weight loss management has not be as effective as desired. -Patient referred to to [facility] and [facility]. Contact information sent via MyChart .  2. Prediabetes -Currently not prescribed medication   3. Vitamin D deficiency -Continue oral vitamin D replacement   4. Vertigo, related to recreational water activities  -Meclizine 25 mg TID PRN   Follow Up Instructions: RTC 12 months    I discussed the assessment and treatment plan with the patient. The patient was provided an opportunity to ask questions and all were answered. The patient agreed with the plan and demonstrated an understanding of the instructions.   The patient was advised to call back or seek an in-person evaluation if the symptoms worsen or if the condition fails to improve as anticipated.  I provided 20  minutes of non-face-to-face time during this encounter.   [name], FNP

## 2019-08-11 ENCOUNTER — Telehealth (INDEPENDENT_AMBULATORY_CARE_PROVIDER_SITE_OTHER): Payer: BC Managed Care – PPO | Admitting: Physician Assistant

## 2019-08-11 DIAGNOSIS — N529 Male erectile dysfunction, unspecified: Secondary | ICD-10-CM | POA: Diagnosis not present

## 2019-08-11 MED ORDER — SILDENAFIL CITRATE 100 MG PO TABS: 50.0000 mg | ORAL_TABLET | Freq: Every day | ORAL | 11 refills | Status: DC | PRN

## 2019-08-11 NOTE — Progress Notes (Signed)
Patient ID: Jacob Roberts, male   DOB: May 11, 1985, 34 y.o.   MRN: [id number] Virtual Visit via Telephone Note  I connected with Lytle Michaels on 08/11/19 at  3:50 PM EDT by telephone and verified that I am speaking with the correct person using two identifiers.   I discussed the limitations, risks, security and privacy concerns of performing an evaluation and management service by telephone and the availability of in person appointments. I also discussed with the patient that there may be a patient responsible charge related to this service. The patient expressed understanding and agreed to proceed.  PATIENT visit by telephone virtually in the context of Covid-19 pandemic. Patient location:  home My Location:  [facility] office Persons on the call:  Me and the patient  History of Present Illness:  Patient has noticed over the last few months that he is having more trouble getting or maintaining an erection.  He denies any cardiac history.  No FH early MI.  He has never taken anything for this.  Sex drive is good.    He has been trying to get bariatric surgery but says currently insurance is not willing to pay for it.      Observations/Objective:  NAD.  A&Ox3   Assessment and Plan: 1. Erectile dysfunction, unspecified erectile dysfunction type -counseled to always alert health care workers that if/when he has taken this.  Try 1/2-1 1 hour prior to SA.  - sildenafil (VIAGRA) 100 MG tablet; Take 0.5-1 tablets (50-100 mg total) by mouth daily as needed for erectile dysfunction.  Dispense: 5 tablet; Refill: 11    Follow Up Instructions: See PCP in 4-6 weeks for recheck of issue   I discussed the assessment and treatment plan with the patient. The patient was provided an opportunity to ask questions and all were answered. The patient agreed with the plan and demonstrated an understanding of the instructions.   The patient was advised to call back or seek an in-person evaluation if the symptoms  worsen or if the condition fails to improve as anticipated.  I provided 13 minutes of non-face-to-face time during this encounter.   Georgian Co, PA-C

## 2019-08-18 ENCOUNTER — Telehealth: Payer: Self-pay | Admitting: Family Medicine

## 2019-08-18 NOTE — Telephone Encounter (Signed)
Please schedule appointment. He has a weird work schedule so he may prefer a virtual but in person is fine.

## 2019-08-18 NOTE — Telephone Encounter (Signed)
If we could schedule an appointment to address this to ensure the letter covers what he needs, I would appreciate that.   Thanks!  Marcy Siren, D.O. Primary Care at [facility]  08/18/2019, 2:03 PM

## 2019-08-18 NOTE — Telephone Encounter (Signed)
Pt called about a letter for weight loss surgery that he needed for his [employer].

## 2019-08-18 NOTE — Telephone Encounter (Signed)
I called the [place] office that he attended the [facility] seminar at & they stated that they aren't requesting a letter for insurance. She says that he might need a referral but he no-showed his new patient appointment on 08/16/19.  Can you clarify what he's asking for?

## 2019-08-18 NOTE — Telephone Encounter (Signed)
Spoke with patient. He states that his job's insurance does not cover WLS. He went to his HR rep & was told that he could appeal the denial & try to get it covered. He needs a letter from his PCP stating his comorbidities & how WLS would improve his health.  Please advise.  Patient is agreeable to making an appointment if need be.

## 2019-08-18 NOTE — Telephone Encounter (Signed)
Called pt nvm

## 2019-09-06 ENCOUNTER — Telehealth: Payer: Self-pay

## 2019-09-06 NOTE — Telephone Encounter (Signed)

## 2019-09-06 NOTE — Patient Instructions (Signed)
Thank you for choosing Primary Care at [facility] to be your medical home!    Jacob Roberts was seen by De Hollingshead, DO today.   Desmond Lope primary care provider is Marcy Siren, DO.   For the best care possible, you should try to see Marcy Siren, DO whenever you come to the clinic.   We look forward to seeing you again soon!  If you have any questions about your visit today, please call us at [phone] or feel free to reach your primary care provider via MyChart.

## 2019-09-07 ENCOUNTER — Other Ambulatory Visit: Payer: Self-pay

## 2019-09-07 ENCOUNTER — Encounter: Payer: Self-pay | Admitting: Internal Medicine

## 2019-09-07 ENCOUNTER — Ambulatory Visit (INDEPENDENT_AMBULATORY_CARE_PROVIDER_SITE_OTHER): Payer: BC Managed Care – PPO | Admitting: Internal Medicine

## 2019-09-07 VITALS — BP 131/89 | HR 88 | Temp 97.9°F | Resp 17 | Ht 70.5 in | Wt 370.0 lb

## 2019-09-07 DIAGNOSIS — R7303 Prediabetes: Secondary | ICD-10-CM | POA: Diagnosis not present

## 2019-09-07 DIAGNOSIS — Z029 Encounter for administrative examinations, unspecified: Secondary | ICD-10-CM

## 2019-09-07 DIAGNOSIS — N529 Male erectile dysfunction, unspecified: Secondary | ICD-10-CM

## 2019-09-07 DIAGNOSIS — Z114 Encounter for screening for human immunodeficiency virus [HIV]: Secondary | ICD-10-CM | POA: Diagnosis not present

## 2019-09-07 NOTE — Progress Notes (Signed)
  Subjective:    Jacob Roberts - 34 y.o. male MRN [id number]  Date of birth: Aug 21, 1985  HPI  Jacob Roberts is here for follow up of chronic medical conditions.  PreDiabetes: Last A1c 5.7% in Sept 2020. Not on any medications.    Health Maintenance:  Health Maintenance Due  Topic Date Due  . HIV Screening  Never done  . COVID-19 Vaccine (1) Never done    -  reports that he has been smoking. He has never used smokeless tobacco. - Review of Systems: Per HPI. - Past Medical History: Patient Active Problem List   Diagnosis Date Noted  . Elevated blood pressure reading 12/21/2018  . Light smoker 12/21/2018  . Morbid obesity (HCC) 12/21/2018  . OSA (obstructive sleep apnea) 12/21/2018  . Body mass index 50.0-59.9, adult (HCC) 08/15/2018  . Lumbar radiculopathy 07/04/2018  . Encounter to establish care 07/04/2018   - Medications: reviewed and updated   Objective:   Physical Exam BP 131/89   Pulse 88   Temp 97.9 F (36.6 C) (Temporal)   Resp 17   Ht 5' 10.5" (1.791 m)   Wt (!) 370 lb (167.8 kg)   SpO2 95%   BMI 52.34 kg/m  Physical Exam  Constitutional: He is oriented to person, place, and time and well-developed, well-nourished, and in no distress. No distress.  HENT:  Head: Normocephalic and atraumatic.  Eyes: Conjunctivae and EOM are normal.  Cardiovascular: Normal rate, regular rhythm and normal heart sounds.  No murmur heard. Pulmonary/Chest: Effort normal and breath sounds normal. No respiratory distress.  Musculoskeletal:        General: Normal range of motion.  Neurological: He is alert and oriented to person, place, and time.  Skin: Skin is warm and dry. He is not diaphoretic.  Psychiatric: Affect and judgment normal.           Assessment & Plan:   1. Prediabetes Will repeat A1c today. Counseled on carb modified diet and 150 minutes of exercise per week.  - Hemoglobin A1c  2. Erectile dysfunction, unspecified erectile dysfunction type Likely  worsened by obesity and other comorbidities. Continue Viagra prn.   3. Morbid obesity (HCC) Unable to continue to follow with [facility] due to work schedule. Patient would likely benefit from bariatric surgery.   4. Screening for HIV (human immunodeficiency virus) - HIV antibody (with reflex)  5. Encounters for administrative purpose Letter provided stating that patient would likely benefit from bariatric surgery and asking for approval from insurance company.       Marcy Siren, D.O. 09/07/2019, 4:26 PM Primary Care at [facility]

## 2019-09-08 LAB — HIV ANTIBODY (ROUTINE TESTING W REFLEX): HIV Screen 4th Generation wRfx: NONREACTIVE

## 2019-09-08 LAB — HEMOGLOBIN A1C
Est. average glucose Bld gHb Est-mCnc: 117 mg/dL
Hgb A1c MFr Bld: 5.7 % — ABNORMAL HIGH (ref 4.8–5.6)

## 2019-09-09 NOTE — Progress Notes (Signed)
Patient notified of results & recommendations. Expressed understanding.

## 2019-09-15 ENCOUNTER — Ambulatory Visit: Payer: BC Managed Care – PPO | Admitting: Internal Medicine

## 2019-11-18 ENCOUNTER — Other Ambulatory Visit: Payer: Self-pay | Admitting: Internal Medicine

## 2019-11-18 MED ORDER — CYCLOBENZAPRINE HCL 5 MG PO TABS: 5.0000 mg | ORAL_TABLET | Freq: Two times a day (BID) | ORAL | 3 refills | Status: DC | PRN

## 2019-11-18 NOTE — Telephone Encounter (Signed)
1) Medication(s) Requested (by name):  cyclobenzaprine (FLEXERIL) 5 MG tablet    2) [facility]acy [facility]  [place] [place]    3) Special Requests:   Approved medications will be sent to the pharmacy, we will reach out if there is an issue.  Requests made after 3pm may not be addressed until the following business day!  If a patient is unsure of the name of the medication(s) please note and ask patient to call back when they are able to provide all info, do not send to responsible party until all information is available!

## 2019-11-26 IMAGING — MR MRI LUMBAR SPINE WITHOUT CONTRAST
4 of 5 series · 18 of 48 positions shown · non-contrast
Comparison: Lumbar spine radiographs 07/01/2018

CLINICAL DATA: Chronic low back pain radiating into the left
buttock and left leg with leg numbness.

EXAM:
MRI LUMBAR SPINE WITHOUT CONTRAST
TECHNIQUE: Multiplanar, multisequence MR imaging of the lumbar spine was
performed. No intravenous contrast was administered.

[Series 6: T2 · sagittal · 4.0mm · 0.73mm/px · 6 of 15 slices shown (1 of 2)]
[im 1/15]
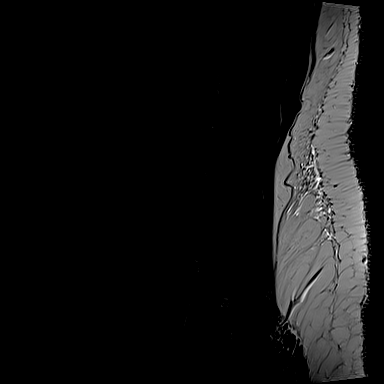
[im 3/15]
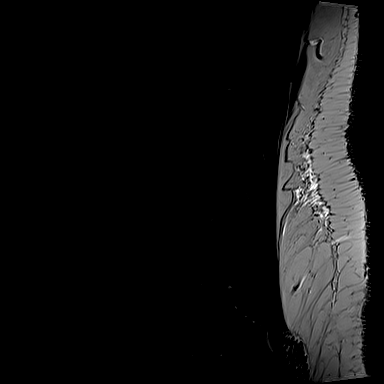
[im 6/15]
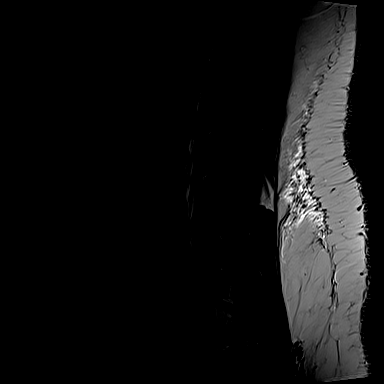
[im 9/15]
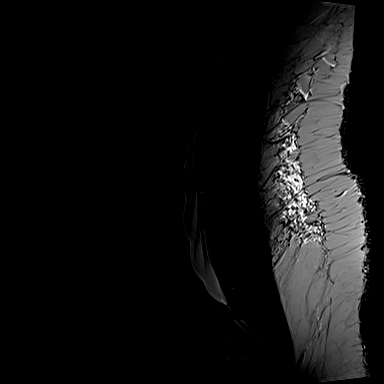
[im 12/15]
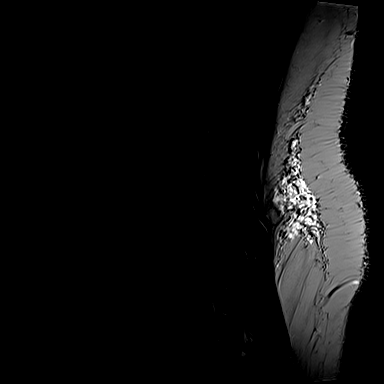
[im 15/15]
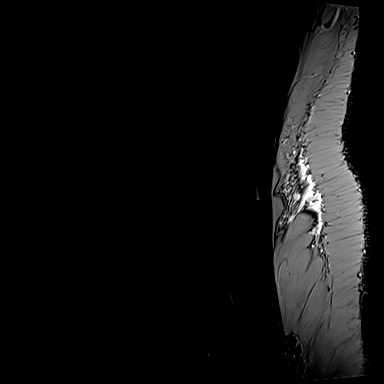

[Series 7: T1 · sagittal · 4.0mm · 0.73mm/px · 3 of 15 slices shown (1 of 2)]
[im 1/15]
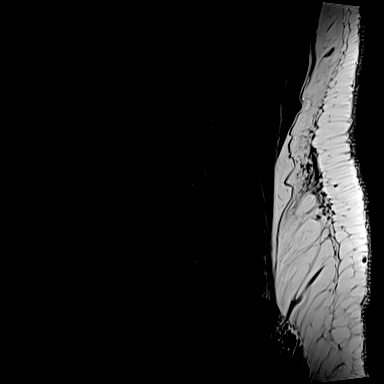
[im 8/15]
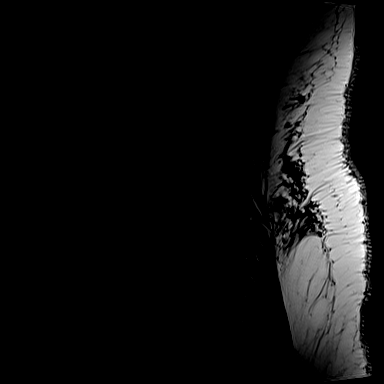
[im 15/15]
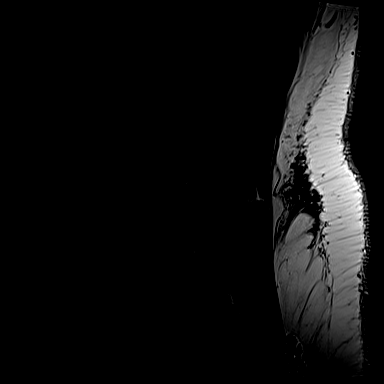

[Series 13: T2 · axial · 4.0mm · 0.31mm/px · z∈[-93,+99]mm · 6 of 44 slices shown (2 of 2)]
[im 3/44]
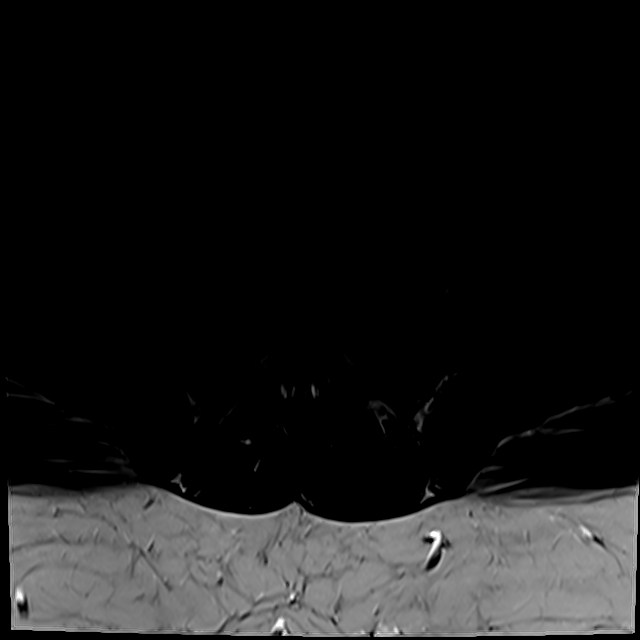
[im 6/44]
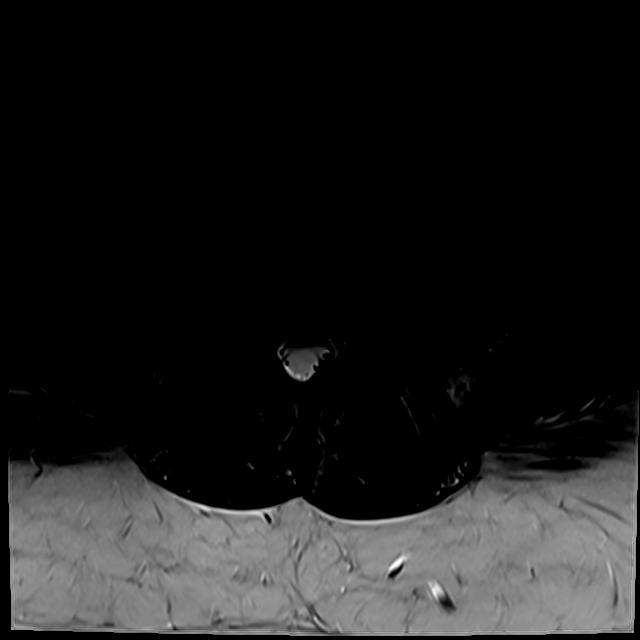
[im 9/44]
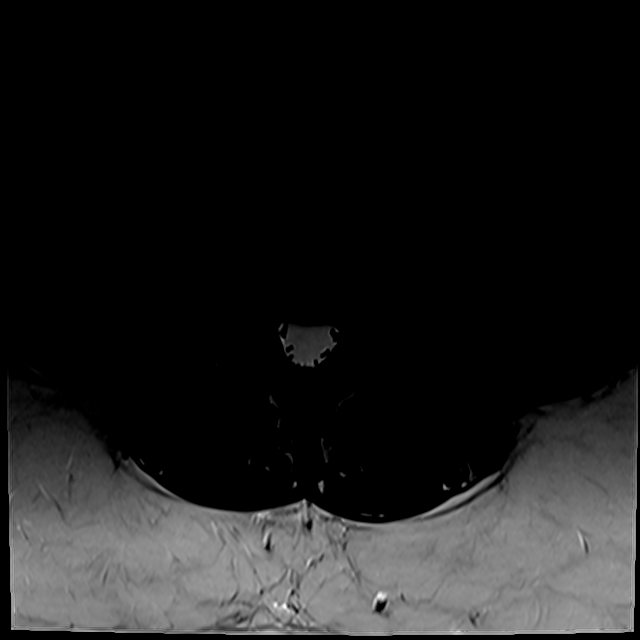
[im 15/44]
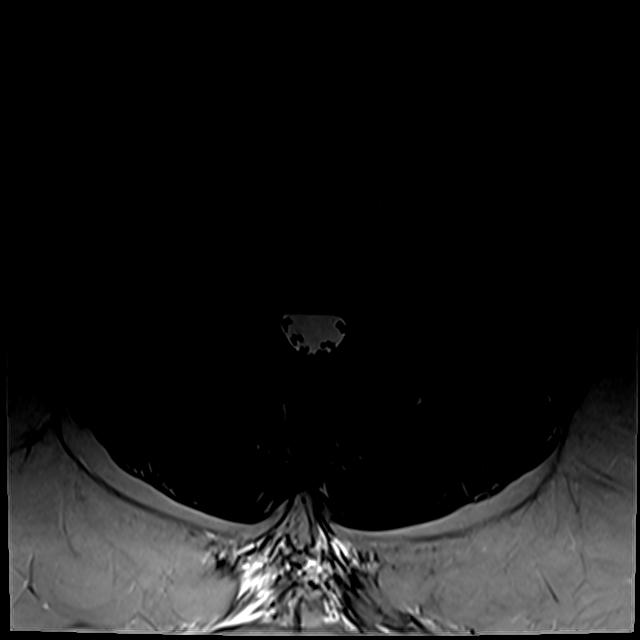
[im 23/44]
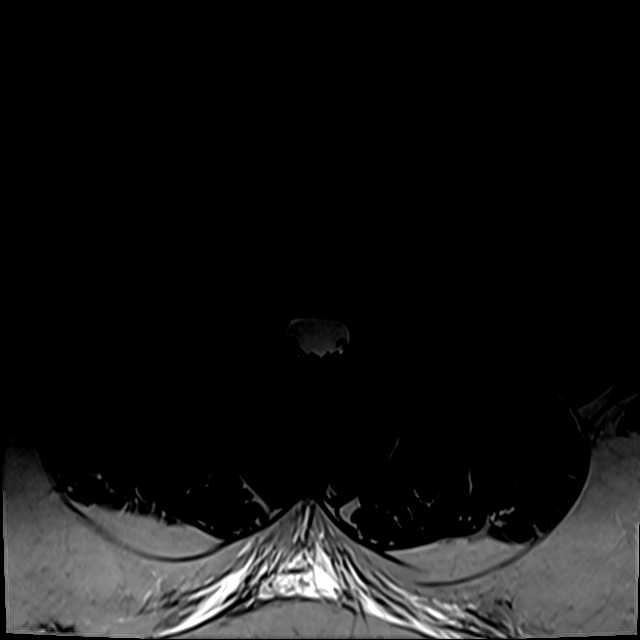
[im 38/44]
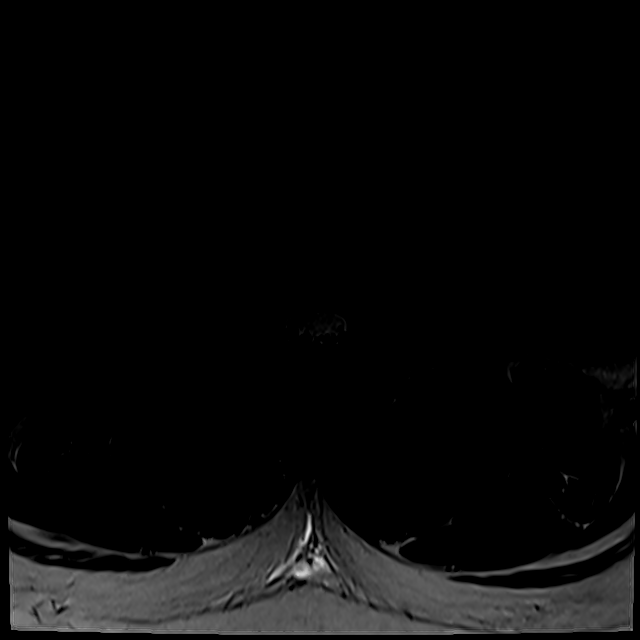

[Series 100: T1 · axial · 4.0mm · 0.31mm/px · z∈[-78,+99]mm · 3 of 44 slices shown (2 of 2)]
[im 6/44]
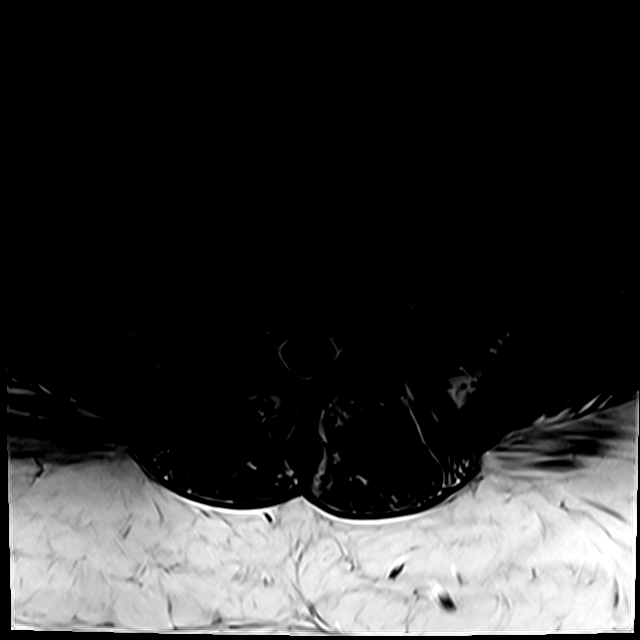
[im 23/44]
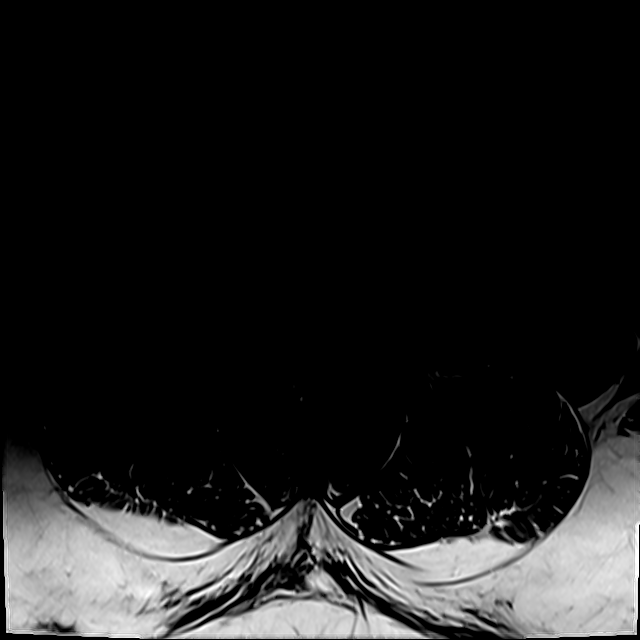
[im 38/44]
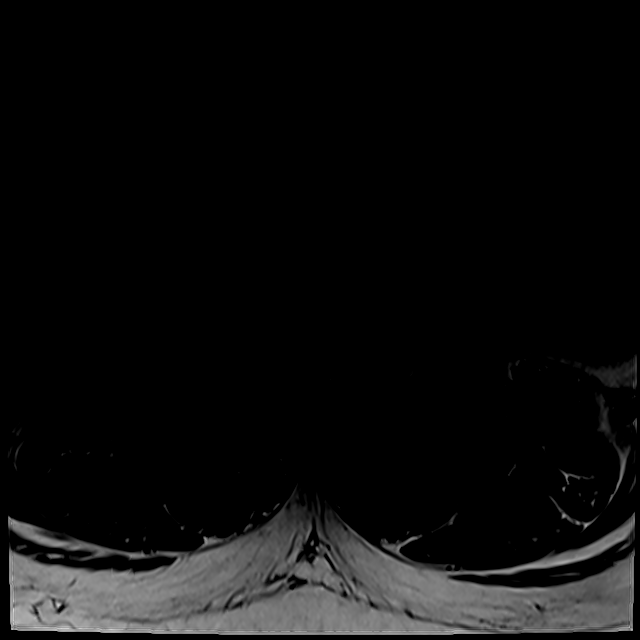

[18 of 48 positions shown; findings below may reference images not displayed]

FINDINGS: Segmentation: The lowest fully formed intervertebral disc space is
designated L5-S1.

Alignment:  Normal.

Vertebrae: No fracture or suspicious osseous lesion. Mild
periarticular marrow and soft tissue edema associated with the left
L4-5 facet joint.

Conus medullaris and cauda equina: Conus extends to the L1 level.
Conus and cauda equina appear normal.

Paraspinal and other soft tissues: Unremarkable.

Disc levels:

L1-2: Negative.

L2-3 and L3-4: Minimal disc bulging without stenosis.

L4-5: Mild disc desiccation with preserved disc space height.
Minimal disc bulging, a small left foraminal disc osteophyte
complex, and mild facet hypertrophy result in mild left neural
foraminal stenosis without spinal stenosis.

L5-S1: Minimal disc bulging without stenosis.
IMPRESSION: 1. Mild lumbar spondylosis with mild left neural foraminal stenosis
at L4-5. No spinal stenosis.
2. L4-5 facet arthrosis with left-sided edema.

## 2020-01-20 ENCOUNTER — Telehealth: Payer: Self-pay | Admitting: Internal Medicine

## 2020-01-20 NOTE — Telephone Encounter (Signed)
Patient called to request a refill on their medication, they were instructed to hang up and contact their pharmacy directly to order their prescriptions even if they are out of refills.  

## 2020-02-23 ENCOUNTER — Encounter: Payer: Self-pay | Admitting: Internal Medicine

## 2020-02-23 ENCOUNTER — Telehealth (INDEPENDENT_AMBULATORY_CARE_PROVIDER_SITE_OTHER): Payer: BC Managed Care – PPO | Admitting: Internal Medicine

## 2020-02-23 DIAGNOSIS — Z6841 Body Mass Index (BMI) 40.0 and over, adult: Secondary | ICD-10-CM

## 2020-02-23 DIAGNOSIS — B360 Pityriasis versicolor: Secondary | ICD-10-CM

## 2020-02-23 DIAGNOSIS — N529 Male erectile dysfunction, unspecified: Secondary | ICD-10-CM

## 2020-02-23 DIAGNOSIS — R21 Rash and other nonspecific skin eruption: Secondary | ICD-10-CM

## 2020-02-23 MED ORDER — ITRACONAZOLE 200 MG PO TABS: 1.0000 | ORAL_TABLET | Freq: Every day | ORAL | 1 refills | Status: DC

## 2020-02-23 MED ORDER — MELOXICAM 15 MG PO TABS: 15.0000 mg | ORAL_TABLET | Freq: Every day | ORAL | 2 refills | Status: DC

## 2020-02-23 MED ORDER — CYCLOBENZAPRINE HCL 5 MG PO TABS: 5.0000 mg | ORAL_TABLET | Freq: Two times a day (BID) | ORAL | 3 refills | Status: DC | PRN

## 2020-02-23 MED ORDER — SILDENAFIL CITRATE 100 MG PO TABS: 50.0000 mg | ORAL_TABLET | Freq: Every day | ORAL | 11 refills | Status: DC | PRN

## 2020-02-23 NOTE — Progress Notes (Signed)
Virtual Visit via MyChart Note  I connected with Lytle Michaels, on 02/23/2020 at 2:07 PM by telephone due to the COVID-19 pandemic and verified that I am speaking with the correct person using two identifiers.   Consent: I discussed the limitations, risks, security and privacy concerns of performing an evaluation and management service by MyChart video and the availability of in person appointments. I also discussed with the patient that there may be a patient responsible charge related to this service. The patient expressed understanding and agreed to proceed.   Location of Patient: Work   Government social research officer of Provider: Clinic    Persons participating in Telemedicine visit: [facility] Dr. Earlene Plater      History of Present Illness: Patient wanted a visit to discuss a flare up of chronic skin condition. Has a history of a fungal skin condition that will cause hypopigmented and hyperpigmented lesions to appear on his chest, back, and face.    Past Medical History:  Diagnosis Date  . Essential hypertension   . Heartburn   . Low back pain   . Morbid obesity (HCC)   . Obstructive sleep apnea   . Prediabetes    Allergies  Allergen Reactions  . Penicillins Hives  . Sulfa Antibiotics Shortness Of Breath    Current Outpatient Medications on File Prior to Visit  Medication Sig Dispense Refill  . cyclobenzaprine (FLEXERIL) 5 MG tablet Take 1 tablet (5 mg total) by mouth 2 (two) times daily as needed for muscle spasms. 60 tablet 3  . meloxicam (MOBIC) 15 MG tablet Take 1 tablet (15 mg total) by mouth daily. 30 tablet 2  . sildenafil (VIAGRA) 100 MG tablet Take 0.5-1 tablets (50-100 mg total) by mouth daily as needed for erectile dysfunction. 5 tablet 11   No current facility-administered medications on file prior to visit.    Observations/Objective: NAD. Speaking clearly.  Work of breathing normal.  Hyerpigmented patches on chest. Hypopigmented macules on face.   Alert and oriented. Mood appropriate.   Assessment and Plan: 1. Tinea versicolor Rash appears consistent with tinea versicolor. Given has failed selsun blue and topical ketoconazole in past, will treat with oral medication.  - Itraconazole 200 MG TABS; Take 1 tablet by mouth daily.  Dispense: 5 tablet; Refill: 1  2. Erectile dysfunction, unspecified erectile dysfunction type - sildenafil (VIAGRA) 100 MG tablet; Take 0.5-1 tablets (50-100 mg total) by mouth daily as needed for erectile dysfunction.  Dispense: 5 tablet; Refill: 11  3. Body mass index 50.0-59.9, adult (HCC) Plans to have bariatric surgery in the spring with next year's insurance cycle.    Follow Up Instructions: Annual exam    I discussed the assessment and treatment plan with the patient. The patient was provided an opportunity to ask questions and all were answered. The patient agreed with the plan and demonstrated an understanding of the instructions.   The patient was advised to call back or seek an in-person evaluation if the symptoms worsen or if the condition fails to improve as anticipated.     I provided 14 minutes total of non-face-to-face time during this encounter including median intraservice time, reviewing previous notes, investigations, ordering medications, medical decision making, coordinating care and patient verbalized understanding at the end of the visit.    Marcy Siren, D.O. Primary Care at [facility]  02/23/2020, 2:07 PM

## 2020-02-24 ENCOUNTER — Other Ambulatory Visit: Payer: Self-pay

## 2020-02-24 ENCOUNTER — Telehealth: Payer: Self-pay

## 2020-02-24 DIAGNOSIS — B360 Pityriasis versicolor: Secondary | ICD-10-CM

## 2020-02-24 MED ORDER — ITRACONAZOLE 100 MG PO CAPS: 200.0000 mg | ORAL_CAPSULE | Freq: Every day | ORAL | 1 refills | Status: DC

## 2020-02-24 NOTE — Telephone Encounter (Signed)
Pharmacy called stating that Itraconazole 200 MG TABS can you write it for a 100 Mg at two capsules  once a day nurse did and completed it.

## 2020-03-28 ENCOUNTER — Encounter: Payer: BC Managed Care – PPO | Admitting: Internal Medicine

## 2020-04-03 ENCOUNTER — Encounter: Payer: BC Managed Care – PPO | Admitting: Internal Medicine

## 2020-05-01 ENCOUNTER — Encounter: Payer: BC Managed Care – PPO | Admitting: Internal Medicine

## 2020-05-11 ENCOUNTER — Other Ambulatory Visit: Payer: Self-pay

## 2020-05-14 ENCOUNTER — Other Ambulatory Visit: Payer: Self-pay

## 2020-05-14 ENCOUNTER — Ambulatory Visit (INDEPENDENT_AMBULATORY_CARE_PROVIDER_SITE_OTHER): Payer: BC Managed Care – PPO | Admitting: Internal Medicine

## 2020-05-14 ENCOUNTER — Encounter: Payer: Self-pay | Admitting: Internal Medicine

## 2020-05-14 VITALS — BP 143/90 | HR 86 | Temp 98.2°F | Resp 20 | Ht 70.0 in | Wt 381.2 lb

## 2020-05-14 DIAGNOSIS — Z130 Encounter for screening for diseases of the blood and blood-forming organs and certain disorders involving the immune mechanism: Secondary | ICD-10-CM

## 2020-05-14 DIAGNOSIS — Z1159 Encounter for screening for other viral diseases: Secondary | ICD-10-CM

## 2020-05-14 DIAGNOSIS — Z13228 Encounter for screening for other metabolic disorders: Secondary | ICD-10-CM

## 2020-05-14 DIAGNOSIS — N529 Male erectile dysfunction, unspecified: Secondary | ICD-10-CM

## 2020-05-14 DIAGNOSIS — Z0000 Encounter for general adult medical examination without abnormal findings: Secondary | ICD-10-CM | POA: Diagnosis not present

## 2020-05-14 DIAGNOSIS — Z8659 Personal history of other mental and behavioral disorders: Secondary | ICD-10-CM

## 2020-05-14 DIAGNOSIS — R41840 Attention and concentration deficit: Secondary | ICD-10-CM

## 2020-05-14 DIAGNOSIS — Z8639 Personal history of other endocrine, nutritional and metabolic disease: Secondary | ICD-10-CM

## 2020-05-14 MED ORDER — CYCLOBENZAPRINE HCL 5 MG PO TABS: 5.0000 mg | ORAL_TABLET | Freq: Two times a day (BID) | ORAL | 3 refills | Status: DC | PRN

## 2020-05-14 MED ORDER — MELOXICAM 15 MG PO TABS: 15.0000 mg | ORAL_TABLET | Freq: Every day | ORAL | 2 refills | Status: DC

## 2020-05-14 MED ORDER — SILDENAFIL CITRATE 100 MG PO TABS: 50.0000 mg | ORAL_TABLET | Freq: Every day | ORAL | 11 refills | Status: DC | PRN

## 2020-05-14 NOTE — Progress Notes (Signed)
Needs RFs on all meds and wants to restart Vit D  Discuss starting Adderrall

## 2020-05-14 NOTE — Progress Notes (Signed)
Subjective:    Jacob Roberts - 35 y.o. male MRN [id number]  Date of birth: Aug 22, 1985  HPI  Jacob Roberts is here for annual exam.   Has concerns about possible adult ADHD. 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 In past had only one primary focus at work. Now having trouble with inattention, feeling pulled in multiple directions, inability to focus/complete tasks appropriately. Neighbor told him that he was prescribed low dose Adderall and asks about this possibility. He was on Ritalin as a child but didn't like how it made him feel. Had to study extra hard to get by in school without assistance of medication.      Health Maintenance:  Health Maintenance Due  Topic Date Due  . Hepatitis C Screening  Never done    -  reports that he has been smoking. He has never used smokeless tobacco. - Review of Systems: Per HPI. - Past Medical History: Patient Active Problem List   Diagnosis Date Noted  . Elevated blood pressure reading 12/21/2018  . Light smoker 12/21/2018  . Morbid obesity (HCC) 12/21/2018  . OSA (obstructive sleep apnea) 12/21/2018  . Body mass index 50.0-59.9, adult (HCC) 08/15/2018  . Lumbar radiculopathy 07/04/2018   - Medications: reviewed and updated   Objective:   Physical Exam BP (!) 143/90 (BP Location: Right Arm, Patient Position: Sitting, Cuff Size: Large)   Pulse 86   Temp 98.2 F (36.8 C) (Oral)   Resp 20   Ht 5\' 10"  (1.778 m)   Wt (!) 381 lb 3.2 oz (172.9 kg)   SpO2 98%   BMI 54.70 kg/m  Physical Exam Constitutional:      Appearance: He is not diaphoretic.  HENT:     Head: Normocephalic and atraumatic.     Mouth/Throat:     Mouth: Oropharynx is clear and moist.      Comments: TMs normal bilaterally Eyes:     Extraocular Movements: EOM normal.     Conjunctiva/sclera: Conjunctivae normal.     Pupils: Pupils are equal, round, and reactive to light.  Neck:     Thyroid: No thyromegaly.   Cardiovascular:     Rate and Rhythm: Normal rate and regular rhythm.     Pulses: Intact distal pulses.     Heart sounds: Normal heart sounds. No murmur heard.   Pulmonary:     Effort: Pulmonary effort is normal. No respiratory distress.     Breath sounds: Normal breath sounds. No wheezing.  Abdominal:     General: Bowel sounds are normal. There is no distension.     Palpations: Abdomen is soft.     Tenderness: There is no abdominal tenderness. There is no guarding or rebound.  Musculoskeletal:        General: No deformity or edema. Normal range of motion.     Cervical back: Normal range of motion and neck supple.  Lymphadenopathy:     Cervical: No cervical adenopathy.  Skin:    General: Skin is warm and dry.     Findings: No rash.  Neurological:     Mental Status: He is alert and oriented to person, place, and time.     Gait: Gait is intact.  Psychiatric:        Mood and Affect: Mood and affect normal.        Judgment: Judgment normal.            Assessment & Plan:   1. Encounter for annual  physical exam Counseled on 150 minutes of exercise per week, healthy eating (including decreased daily intake of saturated fats, cholesterol, added sugars, sodium), STI prevention, routine healthcare maintenance.  2. Screening for metabolic disorder - Comprehensive metabolic panel - Lipid panel  3. Screening for deficiency anemia - CBC  4. Need for hepatitis C screening test - Hepatitis C antibody  5. History of vitamin D deficiency Vit D 14 in Sept 2020. Monitor.  - VITAMIN D 25 Hydroxy (Vit-D Deficiency, Fractures)  6. Inattention 7. History of ADHD Will refer to psych for evaluation for adult ADHD. Discussed that this clinic does not prescribe stimulants but that am happy to place referral.  - Ambulatory referral to Psychiatry  8. Erectile dysfunction, unspecified erectile dysfunction type - sildenafil (VIAGRA) 100 MG tablet; Take 0.5-1 tablets (50-100 mg total) by  mouth daily as needed for erectile dysfunction.  Dispense: 5 tablet; Refill: 11   Marcy Siren, D.O. 05/14/2020, 9:05 AM Primary Care at [facility]

## 2020-05-14 NOTE — Progress Notes (Signed)
Script faxed to pharmacy due to e-scribing error  

## 2020-05-14 NOTE — Addendum Note (Signed)
Addended by: Eloise Levels on: 05/14/2020 12:04 PM   Modules accepted: Orders

## 2020-05-14 NOTE — Progress Notes (Signed)
Meds resent, also printed and faxed to pharmacy due to e-scribing error

## 2020-05-17 ENCOUNTER — Other Ambulatory Visit: Payer: BC Managed Care – PPO

## 2020-05-21 ENCOUNTER — Other Ambulatory Visit: Payer: BC Managed Care – PPO

## 2020-05-21 ENCOUNTER — Telehealth: Payer: Self-pay | Admitting: Internal Medicine

## 2020-05-21 NOTE — Telephone Encounter (Signed)
Pt is requesting to have the lab order sent to [address] [place], [place] [address] (closer to location) not able to make it to [facility] today. Please advise and thank you. Pt needs a call regarding this to know thank you

## 2020-05-23 NOTE — Telephone Encounter (Signed)
Orders faxed to [facility], pt informed that orders sent and he can go there to complete blood work

## 2020-12-06 ENCOUNTER — Other Ambulatory Visit: Payer: Self-pay

## 2020-12-06 ENCOUNTER — Encounter: Payer: Self-pay | Admitting: Family Medicine

## 2020-12-06 ENCOUNTER — Ambulatory Visit (INDEPENDENT_AMBULATORY_CARE_PROVIDER_SITE_OTHER): Payer: BC Managed Care – PPO | Admitting: Family Medicine

## 2020-12-06 VITALS — BP 129/82 | HR 96 | Temp 98.0°F | Resp 15 | Ht 70.0 in | Wt 388.4 lb

## 2020-12-06 DIAGNOSIS — Z6841 Body Mass Index (BMI) 40.0 and over, adult: Secondary | ICD-10-CM | POA: Diagnosis not present

## 2020-12-06 DIAGNOSIS — I10 Essential (primary) hypertension: Secondary | ICD-10-CM

## 2020-12-06 DIAGNOSIS — N529 Male erectile dysfunction, unspecified: Secondary | ICD-10-CM

## 2020-12-06 DIAGNOSIS — M5416 Radiculopathy, lumbar region: Secondary | ICD-10-CM

## 2020-12-06 MED ORDER — SILDENAFIL CITRATE 100 MG PO TABS: 50.0000 mg | ORAL_TABLET | Freq: Every day | ORAL | 11 refills | Status: DC | PRN

## 2020-12-06 MED ORDER — MELOXICAM 15 MG PO TABS
15.0000 mg | ORAL_TABLET | Freq: Every day | ORAL | 1 refills | Status: DC
Start: 2020-12-06 — End: 2020-12-06

## 2020-12-06 MED ORDER — MELOXICAM 15 MG PO TABS: 15.0000 mg | ORAL_TABLET | Freq: Every day | ORAL | 1 refills | Status: DC

## 2020-12-06 MED ORDER — CYCLOBENZAPRINE HCL 10 MG PO TABS
10.0000 mg | ORAL_TABLET | Freq: Three times a day (TID) | ORAL | 2 refills | Status: DC | PRN
Start: 2020-12-06 — End: 2021-08-12

## 2020-12-06 MED ORDER — AMLODIPINE BESYLATE 5 MG PO TABS: 5.0000 mg | ORAL_TABLET | Freq: Every day | ORAL | 0 refills | Status: DC

## 2020-12-06 NOTE — Progress Notes (Signed)
Established Patient Office Visit  Subjective:  Patient ID: Jacob Roberts, male    DOB: 1986/02/07  Age: 35 y.o. MRN: [id number]  CC:  Chief Complaint  Patient presents with   Follow-up    HPI Jacob Roberts presents for follow up of chronic med issues including chronic lowere back pain, ED, and hypertension. Patient denies any acute complaints or concerns.   Past Medical History:  Diagnosis Date   Essential hypertension    Heartburn    Low back pain    Morbid obesity (HCC)    Obstructive sleep apnea    Prediabetes     Past Surgical History:  Procedure Laterality Date   HERNIA REPAIR      Family History  Problem Relation Age of Onset   Hypertension Maternal Grandmother    Hypertension Mother    Thyroid disease Mother    Obesity Mother    Hypertension Father    Obesity Father    Diabetes Brother    Stroke Neg Hx    Hyperlipidemia Neg Hx    Heart disease Neg Hx     Social History   Socioeconomic History   Marital status: Married    Spouse name: Not on file   Number of children: 2   Years of education: Not on file   Highest education level: Not on file  Occupational History   Not on file  Tobacco Use   Smoking status: Some Days   Smokeless tobacco: Never   Tobacco comments:    cigars ocasionaly  Vaping Use   Vaping Use: Never used  Substance and Sexual Activity   Alcohol use: Yes    Comment: occasional   Drug use: Never   Sexual activity: Yes    Partners: Female  Other Topics Concern   Not on file  Social History Narrative   Not on file   Social Determinants of Health   Financial Resource Strain: Not on file  Food Insecurity: Not on file  Transportation Needs: Not on file  Physical Activity: Not on file  Stress: Not on file  Social Connections: Not on file  Intimate Partner Violence: Not on file    Outpatient Medications Prior to Visit  Medication Sig Dispense Refill   cyclobenzaprine (FLEXERIL) 5 MG tablet Take 1 tablet (5 mg  total) by mouth 2 (two) times daily as needed for muscle spasms. 60 tablet 3   meloxicam (MOBIC) 15 MG tablet Take 1 tablet (15 mg total) by mouth daily. 30 tablet 2   sildenafil (VIAGRA) 100 MG tablet Take 0.5-1 tablets (50-100 mg total) by mouth daily as needed for erectile dysfunction. 5 tablet 11   amLODipine (NORVASC) 5 MG tablet Take 1 tablet by mouth daily. (Patient not taking: Reported on 12/06/2020)     No facility-administered medications prior to visit.    Allergies  Allergen Reactions   Penicillins Hives   Sulfa Antibiotics Shortness Of Breath    ROS Review of Systems    Objective:    Physical Exam Vitals and nursing note reviewed.  Constitutional:      General: He is not in acute distress.    Appearance: He is obese.  Cardiovascular:     Rate and Rhythm: Normal rate and regular rhythm.  Pulmonary:     Effort: Pulmonary effort is normal.     Breath sounds: Normal breath sounds.  Abdominal:     Palpations: Abdomen is soft.     Tenderness: There is no abdominal tenderness.  Neurological:  General: No focal deficit present.     Mental Status: He is alert and oriented to person, place, and time.    BP 129/82 (BP Location: Left Arm, Patient Position: Sitting, Cuff Size: Normal)   Pulse 96   Temp 98 F (36.7 C)   Resp 15   Ht 5\' 10"  (1.778 m)   Wt (!) 388 lb 6.4 oz (176.2 kg)   SpO2 96%   BMI 55.73 kg/m  Wt Readings from Last 3 Encounters:  12/06/20 (!) 388 lb 6.4 oz (176.2 kg)  05/14/20 (!) 381 lb 3.2 oz (172.9 kg)  09/07/19 (!) 370 lb (167.8 kg)     Health Maintenance Due  Topic Date Due   Pneumococcal Vaccine 7-60 Years old (1 - PCV) Never done   Hepatitis C Screening  Never done   INFLUENZA VACCINE  11/19/2020        Assessment & Plan:   1. Lumbar radiculopathy Appears stable - meds refilled  - cyclobenzaprine (FLEXERIL) 10 MG tablet; Take 1 tablet (10 mg total) by mouth 3 (three) times daily as needed for muscle spasms.  Dispense: 90  tablet; Refill: 2 - meloxicam (MOBIC) 15 MG tablet; Take 1 tablet (15 mg total) by mouth daily.  Dispense: 90 tablet; Refill: 1  2. Erectile dysfunction, unspecified erectile dysfunction type Meds refilled  - sildenafil (VIAGRA) 100 MG tablet; Take 0.5-1 tablets (50-100 mg total) by mouth daily as needed for erectile dysfunction.  Dispense: 5 tablet; Refill: 11  3. Essential hypertension Discussed compliance. Meds refilled. Will monitor  - amLODipine (NORVASC) 5 MG tablet; Take 1 tablet (5 mg total) by mouth daily.  Dispense: 90 tablet; Refill: 0  4. Body mass index 50.0-59.9, adult (HCC) Discussed dietary and activity options in detail. Goal is 4-6lbs/mo wt loss., will monitor   Follow-up: Return in about 6 months (around 06/08/2021) for physical.    06/10/2021, MD

## 2020-12-06 NOTE — Progress Notes (Signed)
Pt presents for medication refills, needs cyclobenzaprine, meloxicam, and sildenafil

## 2021-05-18 ENCOUNTER — Other Ambulatory Visit: Payer: Self-pay | Admitting: Internal Medicine

## 2021-05-22 ENCOUNTER — Telehealth: Payer: Self-pay | Admitting: Internal Medicine

## 2021-05-22 NOTE — Telephone Encounter (Signed)
Pt called in requesting a refill on his cyclobenzaprine, advised pt he will most likely need an appt   Last Appt: 05/14/20

## 2021-06-11 ENCOUNTER — Ambulatory Visit: Payer: BC Managed Care – PPO | Admitting: Family Medicine

## 2021-08-08 ENCOUNTER — Other Ambulatory Visit: Payer: Self-pay | Admitting: Internal Medicine

## 2021-08-08 DIAGNOSIS — M5416 Radiculopathy, lumbar region: Secondary | ICD-10-CM

## 2021-08-08 NOTE — Telephone Encounter (Signed)
Medication Refill - Medication: cyclobenzaprine (FLEXERIL) 10 MG tablet [[id number]] ? ?meloxicam (MOBIC) 15 MG tablet [[id number]] ? ? ?Has the patient contacted their pharmacy? Yes.  Pharmacy states that they have not heard a response from the provider ? ? ?Preferred Pharmacy (with phone number or street name):  ?[id number] [place], [place] - [address]  ?[address] [place] [address]  ?Phone: [phone] Fax: [phone]  ?Hours: Not open 24 hours  ? ?Has the patient been seen for an appointment in the last year OR does the patient have an upcoming appointment? Yes.  07/23 ? ?Agent: Please be advised that RX refills may take up to 3 business days. We ask that you follow-up with your pharmacy. ? ?

## 2021-08-09 ENCOUNTER — Ambulatory Visit: Payer: Self-pay

## 2021-08-09 NOTE — Telephone Encounter (Signed)
Requested medication (s) are due for refill today: two are, one is older rx ? ?Requested medication (s) are on the active medication list: yes ? ?Last refill:  Mobic 12/06/20 #90 with 1 RF, Flex 72m 12/06/20 #90 with 2 RF ? (This Flexaril is older rx but still on list, Flexaril 596m1/24/#60 with 3 RF ? ?Future visit scheduled: 11/06/21, seen 12/06/20 ? ?Notes to clinic:  Flexaril not delegated, Mobic labs are out of date, please assess. ? ? ?  ? ?Requested Prescriptions  ?Pending Prescriptions Disp Refills  ? cyclobenzaprine (FLEXERIL) 10 MG tablet 90 tablet 2  ?  Sig: Take 1 tablet (10 mg total) by mouth 3 (three) times daily as needed for muscle spasms.  ?  ? Not Delegated - Analgesics:  Muscle Relaxants Failed - 08/08/2021  3:25 PM  ?  ?  Failed - This refill cannot be delegated  ?  ?  Failed - Valid encounter within last 6 months  ?  Recent Outpatient Visits   ? ?      ? 8 months ago [facility]bar rad[facility]  [facility]  ? 1 year ago Encounter for annual physical exam  ? [facility]  ? 1 year [facility]59.9[facility]  ? 1 year ago Prediabetes  ? [facility]  ? 1 year ago Erectile dysfunction, unspecified erectile dysfunction type  ? [facility]? ?  ?  ?Future Appointments   ? ?        ? In 2 months Wi[facility]orna MaiMD [facility]? ?  ? ? ?  ?  ?  ? cyclobenzaprine (FLEXERIL) 5 MG tablet 60 tablet 3  ?  Sig: Take 1 tablet (5 mg total) by mouth 2 (two) times daily as needed for muscle spasms.  ?  ? Not Delegated - Analgesics:  Muscle Relaxants Failed - 08/08/2021  3:25 PM  ?  ?  Failed - This refill cannot be delegated  ?  ?  Failed - Valid encounter within last 6 months  ?  Recent Outpatient Visits   ? ?      ? 8 months ago Lumbar  radiculopathy  ? [facility]  ? 1 year ago Encounter for annual physical exam  ? [facility]  ? 1 year [facility] 50.0-59.9, [facility]  ? 1 year ago Prediabetes  ? [facility]  ? 1 year ago Erectile dysfunction, unspecified erectile dysfunction type  ? [facility]? ?  ?  ?Future Appointments   ? ?        ? In 2 months [facility]MD [facility]? ?  ? ? ?  ?  ?  ? meloxicam (MOBIC) 15 MG tablet 90 tablet 1  ?  Sig: Take 1 tablet (15 mg total) by mouth daily.  ?  ? Analgesics:  COX2 Inhibitors Failed - 08/08/2021  3:25 PM  ?  ?  Failed - Manual Review: Labs are only required  if the patient has taken medication for more than 8 weeks.  ?  ?  Failed - HGB in normal range and within 360 days  ?  Hemoglobin  ?Date Value Ref Range Status  ?12/21/2018 15.8 13.0 - 17.7 g/dL Final  ?  ?  ?  ?  Failed - Cr in normal range and within 360 days  ?  Creatinine, Ser  ?Date Value Ref Range Status  ?12/21/2018 1.11 0.76 - 1.27 mg/dL Final  ?  ?  ?  ?  Failed - HCT in normal range and within 360 days  ?  Hematocrit  ?Date Value Ref Range Status  ?12/21/2018 45.6 37.5 - 51.0 % Final  ?  ?  ?  ?  Failed - AST in normal range and within 360 days  ?  AST  ?Date Value Ref Range Status  ?12/21/2018 22 0 - 40 IU/L Final  ?  ?  ?  ?  Failed - ALT in normal range and within 360 days  ?  ALT  ?Date Value Ref Range Status  ?12/21/2018 31 0 - 44 IU/L Final  ?  ?  ?  ?  Failed - eGFR is 30 or above and within 360 days  ?  GFR calc Af Amer  ?Date Value Ref Range Status  ?12/21/2018 100 >59 mL/min/1.73 Final  ? ?GFR calc non Af Amer  ?Date Value Ref Range Status  ?12/21/2018 86 >59 mL/min/1.73 Final  ?  ?  ?  ?  Passed - Patient is not pregnant  ?  ?  Passed - Valid encounter  within last 12 months  ?  Recent Outpatient Visits   ? ?      ? 8 months ago Lumbar radiculopathy  ? Primary Care at [facility], [place]  ? 1 year ago Encounter for annual physical exam  ? Primary Care at [facility], [name], [place]  ? 1 year ago Body mass index 50.0-59.9, adult [facility])  ? [facility] Care at [facility], [name], [place]  ? 1 year ago Prediabetes  ? [facility] at [facility], [name], [place]  ? 1 year ago Erectile dysfunction, unspecified erectile dysfunction type  ? Primary Care at [facility], Dionne Bucy, [place]  ? ?  ?  ?Future Appointments   ? ?        ? In 2 months [name], MD [facility]  ? ?  ? ? ?  ?  ?  ? ? ?

## 2021-08-09 NOTE — Telephone Encounter (Signed)
? ? ?  Chief Complaint: Low back pain ?Symptoms: "Sciatica" ?Frequency: Has had pain x 2 years ?Pertinent Negatives: Patient denies  ?Disposition: [] ED /[] Urgent Care (no appt availability in office) / [] Appointment(In office/virtual)/ []  [facility]/ [] Home Care/ [] Refused Recommended Disposition /[] [facility]/ []  Follow-up with PCP ?Additional Notes: Pt. Has an appointment for physical. Asking for refills on Flexeril and Meloxicam. Please advise pt.  ?Answer Assessment - Initial Assessment Questions ?1. ONSET: "When did the pain begin?"  ?    2 years ago ?2. LOCATION: "Where does it hurt?" (upper, mid or lower back) ?    Low back ?3. SEVERITY: "How bad is the pain?"  (e.g., Scale 1-10; mild, moderate, or severe) ?  - MILD (1-3): doesn't interfere with normal activities  ?  - MODERATE (4-7): interferes with normal activities or awakens from sleep  ?  - SEVERE (8-10): excruciating pain, unable to do any normal activities  ?    Worse - 6-7 ?4. PATTERN: "Is the pain constant?" (e.g., yes, no; constant, intermittent)  ?    Comes and goes ?5. RADIATION: "Does the pain shoot into your legs or elsewhere?" ?    Yes - left side ?6. CAUSE:  "What do you think is causing the back pain?"  ?    Sciatica ?7. BACK OVERUSE:  "Any recent lifting of heavy objects, strenuous work or exercise?" ?    No ?8. MEDICATIONS: "What have you taken so far for the pain?" (e.g., nothing, acetaminophen, NSAIDS) ?    Meloxicam,flexeril ?9. NEUROLOGIC SYMPTOMS: "Do you have any weakness, numbness, or problems with bowel/bladder control?" ?    No ?10. OTHER SYMPTOMS: "Do you have any other symptoms?" (e.g., fever, abdominal pain, burning with urination, blood in urine) ?      No ?11. PREGNANCY: "Is there any chance you are pregnant?" (e.g., yes, no; LMP) ?      N/A ? ?Protocols used: Back Pain-A-AH ? ?

## 2021-08-09 NOTE — Telephone Encounter (Signed)
Summary: back pain and refill request  ? Pt is having back pain and requested a refill yesterday but his previous provider is [facility] at [facility] / pt is asking if this can be done asap due to pain he is now having/ please advise   ?  ?Called pt - LMOM to return call. ?

## 2021-08-09 NOTE — Telephone Encounter (Signed)
Pt called the to see if his refill request would be filled today / please advise  ?

## 2021-08-12 ENCOUNTER — Ambulatory Visit: Payer: BC Managed Care – PPO | Admitting: Physician Assistant

## 2021-08-12 VITALS — BP 146/95 | HR 95 | Temp 98.2°F | Resp 18 | Ht 70.0 in | Wt 390.0 lb

## 2021-08-12 DIAGNOSIS — I10 Essential (primary) hypertension: Secondary | ICD-10-CM | POA: Diagnosis not present

## 2021-08-12 DIAGNOSIS — Z6841 Body Mass Index (BMI) 40.0 and over, adult: Secondary | ICD-10-CM

## 2021-08-12 DIAGNOSIS — R030 Elevated blood-pressure reading, without diagnosis of hypertension: Secondary | ICD-10-CM | POA: Diagnosis not present

## 2021-08-12 DIAGNOSIS — R7303 Prediabetes: Secondary | ICD-10-CM

## 2021-08-12 DIAGNOSIS — M5416 Radiculopathy, lumbar region: Secondary | ICD-10-CM | POA: Diagnosis not present

## 2021-08-12 DIAGNOSIS — G4733 Obstructive sleep apnea (adult) (pediatric): Secondary | ICD-10-CM | POA: Diagnosis not present

## 2021-08-12 DIAGNOSIS — Z13220 Encounter for screening for lipoid disorders: Secondary | ICD-10-CM

## 2021-08-12 DIAGNOSIS — E559 Vitamin D deficiency, unspecified: Secondary | ICD-10-CM

## 2021-08-12 DIAGNOSIS — Z1159 Encounter for screening for other viral diseases: Secondary | ICD-10-CM

## 2021-08-12 MED ORDER — CYCLOBENZAPRINE HCL 10 MG PO TABS: 10.0000 mg | ORAL_TABLET | Freq: Three times a day (TID) | ORAL | 2 refills | Status: DC | PRN

## 2021-08-12 MED ORDER — MELOXICAM 15 MG PO TABS: 15.0000 mg | ORAL_TABLET | Freq: Every day | ORAL | 1 refills | Status: DC

## 2021-08-12 MED ORDER — AMLODIPINE BESYLATE 5 MG PO TABS: 5.0000 mg | ORAL_TABLET | Freq: Every day | ORAL | 0 refills | Status: DC

## 2021-08-12 NOTE — Progress Notes (Signed)
? ?Established Patient Office Visit ? ?Subjective   ?Patient ID: Jacob Roberts, male    DOB: 11-01-1985  Age: 36 y.o. MRN: [id number] ? ?Chief Complaint  ?Patient presents with  ? Back Pain  ? ? ?States that he continues to have chronic dull back pain.  States that this started after shoveling snow in 2018.  States that he has been taking meloxicam for the past 3 years on an almost daily basis with relief.  States that he will use muscle relaxers, states mostly at night, but does endorse that occasionally he will have to use it several times a day.  States that he has been out of these medications for the past couple of weeks and has noticed worsening pain.  Denies any new injury or trauma.  Denies numbness or tingling. ? ?States that he has been out of his blood pressure medication for approximately the past week.  States that he does check his blood pressure at home on occasion but has not checked it in the past couple of months.  States previous readings at home while taking medication were within normal limits. ? ?States that he does want to work on weight loss, requests Ozempic or Mounjaro. ? ?States that he would like to have another sleep study completed, states that he does feel that he needs to use a CPAP. ? ? ?Past Medical History:  ?Diagnosis Date  ? Essential hypertension   ? Heartburn   ? Low back pain   ? Morbid obesity (HCC)   ? Obstructive sleep apnea   ? Prediabetes   ? ?Social History  ? ?Socioeconomic History  ? Marital status: Married  ?  Spouse name: Not on file  ? Number of children: 2  ? Years of education: Not on file  ? Highest education level: Not on file  ?Occupational History  ? Not on file  ?Tobacco Use  ? Smoking status: Some Days  ? Smokeless tobacco: Never  ? Tobacco comments:  ?  cigars ocasionaly  ?Vaping Use  ? Vaping Use: Never used  ?Substance and Sexual Activity  ? Alcohol use: Yes  ?  Comment: occasional  ? Drug use: Never  ? Sexual activity: Yes  ?  Partners: Female  ?Other  Topics Concern  ? Not on file  ?Social History Narrative  ? Not on file  ? ?Social Determinants of Health  ? ?Financial Resource Strain: Not on file  ?Food Insecurity: Not on file  ?Transportation Needs: Not on file  ?Physical Activity: Not on file  ?Stress: Not on file  ?Social Connections: Not on file  ?Intimate Partner Violence: Not on file  ? ?Family History  ?Problem Relation Age of Onset  ? Hypertension Maternal Grandmother   ? Hypertension Mother   ? Thyroid disease Mother   ? Obesity Mother   ? Hypertension Father   ? Obesity Father   ? Diabetes Brother   ? Stroke Neg Hx   ? Hyperlipidemia Neg Hx   ? Heart disease Neg Hx   ? ?Allergies  ?Allergen Reactions  ? Penicillins Hives  ? Sulfa Antibiotics Shortness Of Breath  ? ?  ? ?Review of Systems  ?Constitutional: Negative.   ?HENT: Negative.    ?Eyes: Negative.   ?Respiratory:  Negative for shortness of breath.   ?Cardiovascular:  Negative for chest pain.  ?Gastrointestinal: Negative.   ?Genitourinary: Negative.   ?Musculoskeletal:  Positive for back pain.  ?Skin: Negative.   ?Neurological: Negative.   ?Endo/Heme/Allergies: Negative.   ?  Psychiatric/Behavioral: Negative.    ? ?  ?Objective:  ?  ? ?BP (!) 146/95 (BP Location: Left Arm, Patient Position: Sitting, Cuff Size: Large)   Pulse 95   Temp 98.2 ?F (36.8 ?C) (Oral)   Resp 18   Ht 5\' 10"  (1.778 m)   Wt (!) 390 lb (176.9 kg)   SpO2 98%   BMI 55.96 kg/m?  ?BP Readings from Last 3 Encounters:  ?08/12/21 (!) 146/95  ?12/06/20 129/82  ?05/14/20 (!) 143/90  ? ?Wt Readings from Last 3 Encounters:  ?08/12/21 (!) 390 lb (176.9 kg)  ?12/06/20 (!) 388 lb 6.4 oz (176.2 kg)  ?05/14/20 (!) 381 lb 3.2 oz (172.9 kg)  ? ?  ? ?Physical Exam ?Vitals and nursing note reviewed.  ?Constitutional:   ?   Appearance: Normal appearance. He is obese.  ?HENT:  ?   Head: Normocephalic and atraumatic.  ?   Right Ear: External ear normal.  ?   Left Ear: External ear normal.  ?   Nose: Nose normal.  ?   Mouth/Throat:  ?   Mouth:  Mucous membranes are moist.  ?   Pharynx: Oropharynx is clear.  ?Eyes:  ?   Extraocular Movements: Extraocular movements intact.  ?   Conjunctiva/sclera: Conjunctivae normal.  ?   Pupils: Pupils are equal, round, and reactive to light.  ?Cardiovascular:  ?   Rate and Rhythm: Normal rate and regular rhythm.  ?   Pulses: Normal pulses.  ?   Heart sounds: Normal heart sounds.  ?Pulmonary:  ?   Effort: Pulmonary effort is normal.  ?   Breath sounds: Normal breath sounds.  ?Musculoskeletal:     ?   General: Normal range of motion.  ?   Cervical back: Normal range of motion and neck supple.  ?Skin: ?   General: Skin is warm and dry.  ?Neurological:  ?   General: No focal deficit present.  ?   Mental Status: He is alert and oriented to person, place, and time.  ?Psychiatric:     ?   Mood and Affect: Mood normal.     ?   Behavior: Behavior normal.     ?   Thought Content: Thought content normal.     ?   Judgment: Judgment normal.  ? ? ? ?No results found for any visits on 08/12/21. ?  ?Assessment & Plan:  ? ?Problem List Items Addressed This Visit   ? ?  ? Respiratory  ? OSA (obstructive sleep apnea) - Primary  ? Relevant Orders  ? Ambulatory referral to Sleep Studies  ?  ? Nervous and Auditory  ? Lumbar radiculopathy  ? Relevant Medications  ? cyclobenzaprine (FLEXERIL) 10 MG tablet  ? meloxicam (MOBIC) 15 MG tablet  ?  ? Other  ? Elevated blood pressure reading  ? ?Other Visit Diagnoses   ? ? Essential hypertension      ? Relevant Medications  ? amLODipine (NORVASC) 5 MG tablet  ? Prediabetes      ? ?  ? ?1. Essential hypertension ?Continue current regimen.  Patient encouraged to check blood pressure at home, keep a written log and have available for all office visits.  Patient given appointment to follow-up with primary care provider. ? ?Patient to return for fasting labs. ? ?Red flags given for prompt reevaluation ?- amLODipine (NORVASC) 5 MG tablet; Take 1 tablet (5 mg total) by mouth daily.  Dispense: 90 tablet;  Refill: 0 ?- CBC with Differential/Platelet; Future ?- Comp.  Metabolic Panel (12); Future ? ?2. Lumbar radiculopathy ?Continue current regimen ?- cyclobenzaprine (FLEXERIL) 10 MG tablet; Take 1 tablet (10 mg total) by mouth 3 (three) times daily as needed for muscle spasms.  Dispense: 90 tablet; Refill: 2 ?- meloxicam (MOBIC) 15 MG tablet; Take 1 tablet (15 mg total) by mouth daily.  Dispense: 90 tablet; Refill: 1 ? ?3. OSA (obstructive sleep apnea) ? ?- Home sleep test; Future ? ?4. Elevated blood pressure reading ? ? ?5. Prediabetes ?Patient education given on lifestyle modifications, consider metformin if patient is still prediabetic. ?- Hemoglobin A1c; Future ? ?6. Vitamin D deficiency ? ?- Vitamin D, 25-hydroxy; Future ? ?7. Screening, lipid ? ?- Lipid panel; Future ? ?8. Encounter for HCV screening test for low risk patient ? ?- HCV Ab w Reflex to Quant PCR; Future ? ?9. Class 3 severe obesity due to excess calories with serious comorbidity and body mass index (BMI) of 50.0 to 59.9 in adult [facility]) ? ? ?I have reviewed the patient's medical history (PMH, PSH, Social History, Family History, Medications, and allergies) , and have been updated if relevant. I spent 30 minutes reviewing chart and  face to face time with patient. ? ? ? ?Return for needs fasting lab appt this week.  ? ? ?Bao Coreas S Mayers, PA-C ? ?

## 2021-08-12 NOTE — Patient Instructions (Signed)
I encourage you to make sure you continue drinking a lot of water every day, great job on that.  Start working on reducing the amount of added sugar in your diet.  I encourage you to track your intake using an app called my fitness pal or lose it. ? ?We will call you with your lab results when they are available. ? ?I have started a referral for you to be seen for a sleep study. ? ?I encourage you to check your blood pressure on a daily basis, keep a written log and have available for all office visits. ? ?Roney Jaffe, PA-C ?Physician Assistant ?[facility] ?[email or web address] ? ? ?Prediabetes Eating Plan ?Prediabetes is a condition that causes blood sugar (glucose) levels to be higher than normal. This increases the risk for developing type 2 diabetes (type 2 diabetes mellitus). Working with a health care provider or nutrition specialist (dietitian) to make diet and lifestyle changes can help prevent the onset of diabetes. These changes may help you: ?Control your blood glucose levels. ?Improve your cholesterol levels. ?Manage your blood pressure. ?What are tips for following this plan? ?Reading food labels ?Read food labels to check the amount of fat, salt (sodium), and sugar in prepackaged foods. Avoid foods that have: ?Saturated fats. ?Trans fats. ?Added sugars. ?Avoid foods that have more than 300 milligrams (mg) of sodium per serving. Limit your sodium intake to less than 2,300 mg each day. ?Shopping ?Avoid buying pre-made and processed foods. ?Avoid buying drinks with added sugar. ?Cooking ?Cook with olive oil. Do not use butter, lard, or ghee. ?Bake, broil, grill, steam, or boil foods. Avoid frying. ?Meal planning ? ?Work with your dietitian to create an eating plan that is right for you. This may include tracking how many calories you take in each day. Use a food diary, notebook, or mobile application to track what you eat at each meal. ?Consider  following a Mediterranean diet. This includes: ?Eating several servings of fresh fruits and vegetables each day. ?Eating fish at least twice a week. ?Eating one serving each day of whole grains, beans, nuts, and seeds. ?Using olive oil instead of other fats. ?Limiting alcohol. ?Limiting red meat. ?Using nonfat or low-fat dairy products. ?Consider following a plant-based diet. This includes dietary choices that focus on eating mostly vegetables and fruit, grains, beans, nuts, and seeds. ?If you have high blood pressure, you may need to limit your sodium intake or follow a diet such as the DASH (Dietary Approaches to Stop Hypertension) eating plan. The DASH diet aims to lower high blood pressure. ?Lifestyle ?Set weight loss goals with help from your health care team. It is recommended that most people with prediabetes lose 7% of their body weight. ?Exercise for at least 30 minutes 5 or more days a week. ?Attend a support group or seek support from a mental health counselor. ?Take over-the-counter and prescription medicines only as told by your health care provider. ?What foods are recommended? ?Fruits ?Berries. Bananas. Apples. Oranges. Grapes. Papaya. Mango. Pomegranate. Kiwi. Grapefruit. Cherries. ?Vegetables ?Lettuce. Spinach. Peas. Beets. Cauliflower. Cabbage. Broccoli. Carrots. Tomatoes. Squash. Eggplant. Herbs. Peppers. Onions. Cucumbers. Brussels sprouts. ?Grains ?Whole grains, such as whole-wheat or whole-grain breads, crackers, cereals, and pasta. Unsweetened oatmeal. Bulgur. Barley. Quinoa. Brown rice. Corn or whole-wheat flour tortillas or taco shells. ?Meats and other proteins ?Seafood. Poultry without skin. Lean cuts of pork and beef. Tofu. Eggs. Nuts. Beans. ?Dairy ?Low-fat or fat-free dairy products, such as yogurt, cottage cheese, and cheese. ?Beverages ?Water.  Tea. Coffee. Sugar-free or diet soda. Seltzer water. Low-fat or nonfat milk. Milk alternatives, such as soy or almond milk. ?Fats and  oils ?Olive oil. Canola oil. Sunflower oil. Grapeseed oil. Avocado. Walnuts. ?Sweets and desserts ?Sugar-free or low-fat pudding. Sugar-free or low-fat ice cream and other frozen treats. ?Seasonings and condiments ?Herbs. Sodium-free spices. Mustard. Relish. Low-salt, low-sugar ketchup. Low-salt, low-sugar barbecue sauce. Low-fat or fat-free mayonnaise. ?The items listed above may not be a complete list of recommended foods and beverages. Contact a dietitian for more information. ?What foods are not recommended? ?Fruits ?Fruits canned with syrup. ?Vegetables ?Canned vegetables. Frozen vegetables with butter or cream sauce. ?Grains ?Refined white flour and flour products, such as bread, pasta, snack foods, and cereals. ?Meats and other proteins ?Fatty cuts of meat. Poultry with skin. Breaded or fried meat. Processed meats. ?Dairy ?Full-fat yogurt, cheese, or milk. ?Beverages ?Sweetened drinks, such as iced tea and soda. ?Fats and oils ?Butter. Lard. Ghee. ?Sweets and desserts ?Baked goods, such as cake, cupcakes, pastries, cookies, and cheesecake. ?Seasonings and condiments ?Spice mixes with added salt. Ketchup. Barbecue sauce. Mayonnaise. ?The items listed above may not be a complete list of foods and beverages that are not recommended. Contact a dietitian for more information. ?Where to find more information ?American Diabetes Association: www.diabetes.org ?Summary ?You may need to make diet and lifestyle changes to help prevent the onset of diabetes. These changes can help you control blood sugar, improve cholesterol levels, and manage blood pressure. ?Set weight loss goals with help from your health care team. It is recommended that most people with prediabetes lose 7% of their body weight. ?Consider following a Mediterranean diet. This includes eating plenty of fresh fruits and vegetables, whole grains, beans, nuts, seeds, fish, and low-fat dairy, and using olive oil instead of other fats. ?This information is  not intended to replace advice given to you by your health care provider. Make sure you discuss any questions you have with your health care provider. ?Document Revised: 07/07/2019 Document Reviewed: 07/07/2019 ?Elsevier Patient Education ? 2023 Elsevier Inc. ? ?

## 2021-08-13 ENCOUNTER — Encounter: Payer: Self-pay | Admitting: Physician Assistant

## 2021-08-13 DIAGNOSIS — R7303 Prediabetes: Secondary | ICD-10-CM | POA: Insufficient documentation

## 2021-08-13 DIAGNOSIS — I10 Essential (primary) hypertension: Secondary | ICD-10-CM | POA: Insufficient documentation

## 2021-08-13 DIAGNOSIS — E559 Vitamin D deficiency, unspecified: Secondary | ICD-10-CM | POA: Insufficient documentation

## 2021-08-16 ENCOUNTER — Other Ambulatory Visit: Payer: BC Managed Care – PPO

## 2021-08-16 DIAGNOSIS — I10 Essential (primary) hypertension: Secondary | ICD-10-CM | POA: Diagnosis not present

## 2021-08-16 DIAGNOSIS — R7303 Prediabetes: Secondary | ICD-10-CM

## 2021-08-16 DIAGNOSIS — E559 Vitamin D deficiency, unspecified: Secondary | ICD-10-CM

## 2021-08-16 DIAGNOSIS — Z1159 Encounter for screening for other viral diseases: Secondary | ICD-10-CM | POA: Diagnosis not present

## 2021-08-16 DIAGNOSIS — Z13220 Encounter for screening for lipoid disorders: Secondary | ICD-10-CM | POA: Diagnosis not present

## 2021-08-17 LAB — CBC WITH DIFFERENTIAL/PLATELET
Basophils Absolute: 0 10*3/uL (ref 0.0–0.2)
Basos: 0 %
EOS (ABSOLUTE): 0.1 10*3/uL (ref 0.0–0.4)
Eos: 2 %
Hematocrit: 46.8 % (ref 37.5–51.0)
Hemoglobin: 16 g/dL (ref 13.0–17.7)
Immature Grans (Abs): 0 10*3/uL (ref 0.0–0.1)
Immature Granulocytes: 0 %
Lymphocytes Absolute: 2.6 10*3/uL (ref 0.7–3.1)
Lymphs: 35 %
MCH: 30 pg (ref 26.6–33.0)
MCHC: 34.2 g/dL (ref 31.5–35.7)
MCV: 88 fL (ref 79–97)
Monocytes Absolute: 0.6 10*3/uL (ref 0.1–0.9)
Monocytes: 8 %
Neutrophils Absolute: 4.1 10*3/uL (ref 1.4–7.0)
Neutrophils: 55 %
Platelets: 306 10*3/uL (ref 150–450)
RBC: 5.34 x10E6/uL (ref 4.14–5.80)
RDW: 13.4 % (ref 11.6–15.4)
WBC: 7.5 10*3/uL (ref 3.4–10.8)

## 2021-08-17 LAB — COMP. METABOLIC PANEL (12)
AST: 26 IU/L (ref 0–40)
Albumin/Globulin Ratio: 1.5 (ref 1.2–2.2)
Albumin: 4.2 g/dL (ref 4.0–5.0)
Alkaline Phosphatase: 119 IU/L (ref 44–121)
BUN/Creatinine Ratio: 10 (ref 9–20)
BUN: 11 mg/dL (ref 6–20)
Bilirubin Total: 0.2 mg/dL (ref 0.0–1.2)
Calcium: 9.2 mg/dL (ref 8.7–10.2)
Chloride: 102 mmol/L (ref 96–106)
Creatinine, Ser: 1.09 mg/dL (ref 0.76–1.27)
Globulin, Total: 2.8 g/dL (ref 1.5–4.5)
Glucose: 93 mg/dL (ref 70–99)
Potassium: 4.7 mmol/L (ref 3.5–5.2)
Sodium: 140 mmol/L (ref 134–144)
Total Protein: 7 g/dL (ref 6.0–8.5)
eGFR: 90 mL/min/{1.73_m2} (ref >59)

## 2021-08-17 LAB — LIPID PANEL
Chol/HDL Ratio: 4.5 ratio (ref 0.0–5.0)
Cholesterol, Total: 174 mg/dL (ref 100–199)
HDL: 39 mg/dL — ABNORMAL LOW (ref >39)
LDL Chol Calc (NIH): 126 mg/dL — ABNORMAL HIGH (ref 0–99)
Triglycerides: 47 mg/dL (ref 0–149)
VLDL Cholesterol Cal: 9 mg/dL (ref 5–40)

## 2021-08-17 LAB — HEMOGLOBIN A1C
Est. average glucose Bld gHb Est-mCnc: 123 mg/dL
Hgb A1c MFr Bld: 5.9 % — ABNORMAL HIGH (ref 4.8–5.6)

## 2021-08-17 LAB — HCV AB W REFLEX TO QUANT PCR: HCV Ab: NONREACTIVE

## 2021-08-17 LAB — HCV INTERPRETATION

## 2021-08-17 LAB — VITAMIN D 25 HYDROXY (VIT D DEFICIENCY, FRACTURES): Vit D, 25-Hydroxy: 15.7 ng/mL — ABNORMAL LOW (ref 30.0–100.0)

## 2021-08-19 MED ORDER — METFORMIN HCL ER 500 MG PO TB24: 500.0000 mg | ORAL_TABLET | Freq: Every day | ORAL | 1 refills | Status: AC

## 2021-08-19 MED ORDER — VITAMIN D (ERGOCALCIFEROL) 1.25 MG (50000 UNIT) PO CAPS: 50000.0000 [IU] | ORAL_CAPSULE | ORAL | 2 refills | Status: AC

## 2021-08-19 NOTE — Addendum Note (Signed)
Addended by: Roney Jaffe on: 08/19/2021 08:09 AM ? ? Modules accepted: Orders ? ?

## 2021-08-22 ENCOUNTER — Telehealth: Payer: Self-pay | Admitting: *Deleted

## 2021-08-22 NOTE — Telephone Encounter (Signed)
-----   Message from Roney Jaffe, [place] sent at 08/19/2021  8:08 AM EDT ----- ?Please call patient and let him know that his A1c was 5.9.  He has been in the prediabetic range for the past 2 years, based on our last office visit, he does not want to do a trial of metformin.  Prescription sent to his pharmacy. ? ?His kidney function and liver function are within normal limits, he does not show signs of anemia.  His screening for hepatitis C was negative. ? ?His vitamin D is low, he needs to take 50,000 units once a week for the next 12 weeks and have it rechecked at that time.  Prescription sent to his pharmacy ? ?His cholesterol overall is within normal limits, however his LDL continues to be elevated.  It is very important that he work on a low-cholesterol diet, and continue to work on weight loss.  He should have these levels rechecked in 3 to 6 months. ? ? ? ? ?

## 2021-08-22 NOTE — Telephone Encounter (Signed)
Patient verified DOB ?Patient is aware of labs and needing to adhere to a low sugar/carb diet while taking metformin and vitamin d. Rechecks will be completed in ?

## 2021-09-15 ENCOUNTER — Other Ambulatory Visit: Payer: Self-pay | Admitting: Physician Assistant

## 2021-09-15 DIAGNOSIS — R7303 Prediabetes: Secondary | ICD-10-CM

## 2021-09-17 NOTE — Telephone Encounter (Signed)
Requested medications are due for refill today.  A bit too soon  Requested medications are on the active medications list.  yes  Last refill. 08/19/2021 #30 1 refill  Future visit scheduled.   yes  Notes to clinic.  Pt last seen by Jacob Roberts.PCP listed as dr. Juleen Roberts.    Requested Prescriptions  Pending Prescriptions Disp Refills   metFORMIN (GLUCOPHAGE-XR) 500 MG 24 hr tablet [Pharmacy Med Name: METFORMIN HCL ER 500 MG TABLET] 30 tablet 1    Sig: TAKE 1 TABLET BY MOUTH EVERY DAY WITH BREAKFAST     Endocrinology:  Diabetes - Biguanides Failed - 09/15/2021 11:32 AM      Failed - B12 Level in normal range and within 720 days    Vitamin B-12  Date Value Ref Range Status  12/22/2018 323 232 - 1,245 pg/mL Final         Passed - Cr in normal range and within 360 days    Creatinine, Ser  Date Value Ref Range Status  08/16/2021 1.09 0.76 - 1.27 mg/dL Final         Passed - HBA1C is between 0 and 7.9 and within 180 days    Hgb A1c MFr Bld  Date Value Ref Range Status  08/16/2021 5.9 (H) 4.8 - 5.6 % Final    Comment:             Prediabetes: 5.7 - 6.4          Diabetes: >6.4          Glycemic control for adults with diabetes: <7.0          Passed - eGFR in normal range and within 360 days    GFR calc Af Amer  Date Value Ref Range Status  12/21/2018 100 >59 mL/min/1.73 Final   GFR calc non Af Amer  Date Value Ref Range Status  12/21/2018 86 >59 mL/min/1.73 Final   eGFR  Date Value Ref Range Status  08/16/2021 90 >59 mL/min/1.73 Final         Passed - Valid encounter within last 6 months    Recent Outpatient Visits           1 month ago OSA (obstructive sleep apnea)   Primary Care at [facility], Jacob S, PA-C   9 months ago Lumbar radiculopathy   Primary Care at [facility], MD   1 year ago Encounter for annual physical exam   Primary Care at [facility], Jacob Beaver, MD   1 year ago Body mass index 50.0-59.9, adult  [facility])   Primary Care at [facility], Jacob Beaver, MD   2 years ago Prediabetes   Primary Care at [facility] Jacob Beaver, MD       Future Appointments             In 1 month Jacob Mai, MD Primary Care at [facility] - CBC within normal limits and completed in the last 12 months    WBC  Date Value Ref Range Status  08/16/2021 7.5 3.4 - 10.8 x10E3/uL Final   RBC  Date Value Ref Range Status  08/16/2021 5.34 4.14 - 5.80 x10E6/uL Final   Hemoglobin  Date Value Ref Range Status  08/16/2021 16.0 13.0 - 17.7 g/dL Final   Hematocrit  Date Value Ref Range Status  08/16/2021 46.8 37.5 - 51.0 % Final   MCHC  Date Value Ref Range Status  08/16/2021 34.2 31.5 - 35.7 g/dL Final   [facility]  Date Value Ref Range Status  08/16/2021 30.0 26.6 - 33.0 pg Final   MCV  Date Value Ref Range Status  08/16/2021 88 79 - 97 fL Final   No results found for: PLTCOUNTKUC, LABPLAT, POCPLA RDW  Date Value Ref Range Status  08/16/2021 13.4 11.6 - 15.4 % Final

## 2021-10-07 ENCOUNTER — Ambulatory Visit (HOSPITAL_BASED_OUTPATIENT_CLINIC_OR_DEPARTMENT_OTHER): Payer: BC Managed Care – PPO | Attending: Physician Assistant | Admitting: Internal Medicine

## 2021-10-07 VITALS — Ht 70.0 in | Wt 380.0 lb

## 2021-10-07 DIAGNOSIS — G4733 Obstructive sleep apnea (adult) (pediatric): Secondary | ICD-10-CM | POA: Insufficient documentation

## 2021-10-17 ENCOUNTER — Encounter: Payer: Self-pay | Admitting: Physician Assistant

## 2021-10-19 DIAGNOSIS — G4733 Obstructive sleep apnea (adult) (pediatric): Secondary | ICD-10-CM | POA: Diagnosis not present

## 2021-10-19 NOTE — Procedures (Signed)
    Patient Name: Jacob Roberts, Jacob Roberts Date: 10/07/2021 Gender: Male D.O.B: [birth year] Age (years): 36 Referring Provider: Kasandra Knudsen Mayers PA-C Height (inches): 70 Interpreting Physician: Jetty Duhamel MD, ABSM Weight (lbs): 390 RPSGT: [place] BMI: 56 MRN: [id number] Neck Size: 20.00  CLINICAL INFORMATION Sleep Study Type: HST Indication for sleep study: OSA Epworth Sleepiness Score: 9  SLEEP STUDY TECHNIQUE A multi-channel overnight portable sleep study was performed. The channels recorded were: nasal airflow, thoracic respiratory movement, and oxygen saturation with a pulse oximetry. Snoring was also monitored.  MEDICATIONS Patient self administered medications include: none reported.  SLEEP ARCHITECTURE Patient was studied for 369.5 minutes. The sleep efficiency was 100.0 % and the patient was supine for 0%. The arousal index was 0.0 per hour.  RESPIRATORY PARAMETERS The overall AHI was 91.9 per hour, with a central apnea index of 0 per hour. The oxygen nadir was 79% during sleep.  CARDIAC DATA Mean heart rate during sleep was 90.7 bpm.  IMPRESSIONS - Severe obstructive sleep apnea occurred during this study (AHI = 91.9/h). - Oxygen desaturation was noted during this study (Min O2 = 79%). Mean 97% - Patient snored.  DIAGNOSIS - Obstructive Sleep Apnea (G47.33)  RECOMMENDATIONS - Suggest CPAP titration sleep study or autopap. Other options would be based on clinical judgment. - Be careful with alcohol, sedatives and other CNS depressants that may worsen sleep apnea and disrupt normal sleep architecture. - Sleep hygiene should be reviewed to assess factors that may improve sleep quality. - Weight management and regular exercise should be initiated or continued.  [Electronically signed] 10/19/2021 10:17 AM  Jetty Duhamel MD, ABSM Diplomate, American Board of Sleep Medicine NPI: [id number]                         Jetty Duhamel Diplomate, American Board of Sleep Medicine  ELECTRONICALLY SIGNED ON:  10/19/2021, 10:14 AM [facility] PH: [phone]   FX: [phone] ACCREDITED BY THE AMERICAN ACADEMY OF SLEEP MEDICINE

## 2021-11-06 ENCOUNTER — Ambulatory Visit (INDEPENDENT_AMBULATORY_CARE_PROVIDER_SITE_OTHER): Payer: BC Managed Care – PPO | Admitting: Family Medicine

## 2021-11-06 ENCOUNTER — Encounter: Payer: Self-pay | Admitting: Family Medicine

## 2021-11-06 VITALS — BP 132/87 | HR 93 | Temp 98.1°F | Resp 16 | Wt 379.6 lb

## 2021-11-06 DIAGNOSIS — I10 Essential (primary) hypertension: Secondary | ICD-10-CM

## 2021-11-06 DIAGNOSIS — R7303 Prediabetes: Secondary | ICD-10-CM | POA: Diagnosis not present

## 2021-11-06 DIAGNOSIS — Z6841 Body Mass Index (BMI) 40.0 and over, adult: Secondary | ICD-10-CM

## 2021-11-06 DIAGNOSIS — G4733 Obstructive sleep apnea (adult) (pediatric): Secondary | ICD-10-CM

## 2021-11-06 DIAGNOSIS — B360 Pityriasis versicolor: Secondary | ICD-10-CM

## 2021-11-06 MED ORDER — FLUCONAZOLE 150 MG PO TABS
300.0000 mg | ORAL_TABLET | ORAL | 0 refills | Status: AC
Start: 2021-11-06 — End: 2021-11-28

## 2021-11-06 MED ORDER — KETOCONAZOLE 2 % EX CREA: 1.0000 | TOPICAL_CREAM | Freq: Every day | CUTANEOUS | 1 refills | Status: AC

## 2021-11-06 NOTE — Progress Notes (Signed)
Patient is here for complete physical examination Patient concern about sleep study results Patient said metformin makes his stomach hurts

## 2021-11-07 ENCOUNTER — Other Ambulatory Visit: Payer: Self-pay | Admitting: Physician Assistant

## 2021-11-07 ENCOUNTER — Encounter: Payer: Self-pay | Admitting: Family Medicine

## 2021-11-07 DIAGNOSIS — M5416 Radiculopathy, lumbar region: Secondary | ICD-10-CM

## 2021-11-07 NOTE — Progress Notes (Signed)
Established Patient Office Visit  Subjective    Patient ID: Jacob Roberts, male    DOB: [birth year]  Age: 36 y.o. MRN: [id number]  CC: No chief complaint on file.   HPI Jacob Roberts presents for follow up of chronic med issues. Patient also reports recurrence of tinea versicolor.    Outpatient Encounter Medications as of 11/06/2021  Medication Sig   amLODipine (NORVASC) 5 MG tablet Take 1 tablet (5 mg total) by mouth daily.   cyclobenzaprine (FLEXERIL) 10 MG tablet Take 1 tablet (10 mg total) by mouth 3 (three) times daily as needed for muscle spasms.   fluconazole (DIFLUCAN) 150 MG tablet Take 2 tablets (300 mg total) by mouth every 7 (seven) days for 4 doses.   ketoconazole (NIZORAL) 2 % cream Apply 1 Application topically daily.   meloxicam (MOBIC) 15 MG tablet Take 1 tablet (15 mg total) by mouth daily.   metFORMIN (GLUCOPHAGE-XR) 500 MG 24 hr tablet Take 1 tablet (500 mg total) by mouth daily with breakfast.   sildenafil (VIAGRA) 100 MG tablet Take 0.5-1 tablets (50-100 mg total) by mouth daily as needed for erectile dysfunction.   Vitamin D, Ergocalciferol, (DRISDOL) 1.25 MG (50000 UNIT) CAPS capsule Take 1 capsule (50,000 Units total) by mouth every 7 (seven) days.   No facility-administered encounter medications on file as of 11/06/2021.    Past Medical History:  Diagnosis Date   Essential hypertension    Heartburn    Low back pain    Morbid obesity (HCC)    Obstructive sleep apnea    Prediabetes     Past Surgical History:  Procedure Laterality Date   HERNIA REPAIR      Family History  Problem Relation Age of Onset   Hypertension Maternal Grandmother    Diabetes Maternal Grandmother    Hypertension Mother    Thyroid disease Mother    Obesity Mother    Hypertension Father    Obesity Father    Diabetes Brother    Obesity Brother    Diabetes Paternal Uncle    Stroke Neg Hx    Hyperlipidemia Neg Hx    Heart disease Neg Hx     Social History    Socioeconomic History   Marital status: Married    Spouse name: Not on file   Number of children: 2   Years of education: Not on file   Highest education level: Not on file  Occupational History   Not on file  Tobacco Use   Smoking status: Some Days    Types: Cigars   Smokeless tobacco: Never   Tobacco comments:    cigars ocasionaly  Vaping Use   Vaping Use: Never used  Substance and Sexual Activity   Alcohol use: Yes    Alcohol/week: 3.0 standard drinks of alcohol    Types: 3 Standard drinks or equivalent per week    Comment: occasional   Drug use: Never   Sexual activity: Yes    Partners: Female  Other Topics Concern   Not on file  Social History Narrative   Not on file   Social Determinants of Health   Financial Resource Strain: Not on file  Food Insecurity: Not on file  Transportation Needs: Not on file  Physical Activity: Not on file  Stress: Not on file  Social Connections: Not on file  Intimate Partner Violence: Not on file    Review of Systems  All other systems reviewed and are negative.       Objective  BP 132/87   Pulse 93   Temp 98.1 F (36.7 C) (Oral)   Resp 16   Wt (!) 379 lb 9.6 oz (172.2 kg)   SpO2 98%   BMI 54.47 kg/m   Physical Exam Vitals and nursing note reviewed.  Constitutional:      General: He is not in acute distress.    Appearance: He is obese.  Cardiovascular:     Rate and Rhythm: Normal rate and regular rhythm.  Pulmonary:     Effort: Pulmonary effort is normal.     Breath sounds: Normal breath sounds.  Abdominal:     Palpations: Abdomen is soft.     Tenderness: There is no abdominal tenderness.  Neurological:     General: No focal deficit present.     Mental Status: He is alert and oriented to person, place, and time.         Assessment & Plan:   1. OSA (obstructive sleep apnea) Referral for cpap - For home use only DME continuous positive airway pressure (CPAP)  2. Essential  hypertension Appears stable. Continue and monitor  3. Prediabetes Discussed dietary and activity options.   4. Class 3 severe obesity due to excess calories with serious comorbidity and body mass index (BMI) of 50.0 to 59.9 in adult [facility]) Discussed dietary and activity options.   5. Tinea versicolor Diflucan and nizoral prescribed. Skin care discussed    Return in about 3 months (around 02/06/2022) for follow up.   Tommie Raymond, MD

## 2021-11-09 ENCOUNTER — Other Ambulatory Visit: Payer: Self-pay | Admitting: Physician Assistant

## 2021-11-09 DIAGNOSIS — I10 Essential (primary) hypertension: Secondary | ICD-10-CM

## 2021-11-14 ENCOUNTER — Telehealth: Payer: Self-pay | Admitting: Family Medicine

## 2021-11-14 NOTE — Telephone Encounter (Signed)
Copied from CRM [id number]. Topic: General - Other >> Nov 14, 2021 10:40 AM Dondra Prader E wrote: Reason for CRM: Pressure setting is missing for the CPAP device orders  Best contact: [phone] [facility] Distance calling from [facility]

## 2021-11-15 NOTE — Telephone Encounter (Signed)
Not sure what this is. 

## 2021-11-25 NOTE — Telephone Encounter (Signed)
Jacob Roberts said auto would be just fine....please advise

## 2021-11-27 ENCOUNTER — Encounter (INDEPENDENT_AMBULATORY_CARE_PROVIDER_SITE_OTHER): Payer: Self-pay

## 2021-11-27 NOTE — Telephone Encounter (Signed)
Jacob Roberts said auto would be just fine....please advise Copied from CRM [id number]. Topic: General - Other >> Nov 26, 2021 10:26 AM Clide Dales wrote: Jacob Roberts with [facility] called and said the order for the patients cpap did not have a pressure setting or length of need. Please follow up with Jacob Roberts.

## 2021-12-02 ENCOUNTER — Other Ambulatory Visit: Payer: Self-pay | Admitting: *Deleted

## 2021-12-02 NOTE — Telephone Encounter (Signed)
Spoke with [facility] Distance Provider to redo order

## 2021-12-02 NOTE — Telephone Encounter (Signed)
Jacob Roberts w/ [facility] checking status of cpap request  Please assist further

## 2021-12-03 ENCOUNTER — Other Ambulatory Visit: Payer: Self-pay | Admitting: Family Medicine

## 2021-12-03 DIAGNOSIS — G4733 Obstructive sleep apnea (adult) (pediatric): Secondary | ICD-10-CM

## 2021-12-11 NOTE — Telephone Encounter (Signed)
Jacob Roberts has called again and states they are still missing a signature and pressure settings, request FU call [phone], before order moves forward.

## 2021-12-12 NOTE — Telephone Encounter (Signed)
Please redo order for a start and finish titration

## 2021-12-18 NOTE — Telephone Encounter (Signed)
This has been order and resent to fax number

## 2021-12-18 NOTE — Telephone Encounter (Signed)
Caller checking on the status of c pap device with supplies and c pap pressure settings orders please fax # [phone].

## 2022-01-08 NOTE — Telephone Encounter (Signed)
Pt has called and states as of 9/6 [facility], Barbaraann Rondo states has never received fax with settings as requested below. Pt # [phone]

## 2022-01-10 NOTE — Telephone Encounter (Addendum)
Error

## 2022-01-10 NOTE — Telephone Encounter (Addendum)
Requesting orders reflecting CPAP settings, please fax to  [phone]. Awaiting order since 11/14/2021

## 2022-01-22 ENCOUNTER — Other Ambulatory Visit: Payer: Self-pay | Admitting: *Deleted

## 2022-01-24 ENCOUNTER — Telehealth: Payer: Self-pay | Admitting: *Deleted

## 2022-01-24 NOTE — Telephone Encounter (Signed)
Call place to patient regarding his sleep study. Per the sleep center nurse tech, [facility], Dr. Annamaria Boots recommended for patient to have in lab sleep test. Patient was never titrated due to the findings on sleep study. Patient aware that he may need to get "in lab study".

## 2022-01-27 ENCOUNTER — Other Ambulatory Visit: Payer: Self-pay | Admitting: Family Medicine

## 2022-01-27 ENCOUNTER — Other Ambulatory Visit: Payer: Self-pay | Admitting: Physician Assistant

## 2022-01-27 DIAGNOSIS — I10 Essential (primary) hypertension: Secondary | ICD-10-CM

## 2022-01-27 DIAGNOSIS — M5416 Radiculopathy, lumbar region: Secondary | ICD-10-CM

## 2022-01-28 NOTE — Telephone Encounter (Signed)
Requested Prescriptions  Pending Prescriptions Disp Refills  . amLODipine (NORVASC) 5 MG tablet [Pharmacy Med Name: AMLODIPINE BESYLATE 5 MG TAB] 90 tablet 0    Sig: TAKE 1 TABLET (5 MG TOTAL) BY MOUTH DAILY.     Cardiovascular: Calcium Channel Blockers 2 Passed - 01/27/2022  5:47 PM      Passed - Last BP in normal range    BP Readings from Last 1 Encounters:  11/06/21 132/87         Passed - Last Heart Rate in normal range    Pulse Readings from Last 1 Encounters:  11/06/21 93         Passed - Valid encounter within last 6 months    Recent Outpatient Visits          2 months ago OSA (obstructive sleep apnea)   [facility], MD   5 months ago OSA (obstructive sleep apnea)   Primary Care at [facility], Loraine Grip, PA-C   1 year ago Lumbar radiculopathy   Primary Care at [facility], MD   1 year ago Encounter for annual physical exam   Primary Care at [facility], [name], MD   1 year ago Body mass index 50.0-59.9, adult [facility])   Primary Care at [facility], [name], MD      Future Appointments            In 6 days [name], MD Primary Care at [facility]

## 2022-01-28 NOTE — Telephone Encounter (Signed)
Requested Prescriptions  Pending Prescriptions Disp Refills  . meloxicam (MOBIC) 15 MG tablet [Pharmacy Med Name: MELOXICAM 15 MG TABLET] 90 tablet 1    Sig: TAKE 1 TABLET (15 MG TOTAL) BY MOUTH DAILY.     Analgesics:  COX2 Inhibitors Failed - 01/27/2022  5:47 PM      Failed - Manual Review: Labs are only required if the patient has taken medication for more than 8 weeks.      Failed - ALT in normal range and within 360 days    ALT  Date Value Ref Range Status  12/21/2018 31 0 - 44 IU/L Final         Passed - HGB in normal range and within 360 days    Hemoglobin  Date Value Ref Range Status  08/16/2021 16.0 13.0 - 17.7 g/dL Final         Passed - Cr in normal range and within 360 days    Creatinine, Ser  Date Value Ref Range Status  08/16/2021 1.09 0.76 - 1.27 mg/dL Final         Passed - HCT in normal range and within 360 days    Hematocrit  Date Value Ref Range Status  08/16/2021 46.8 37.5 - 51.0 % Final         Passed - AST in normal range and within 360 days    AST  Date Value Ref Range Status  08/16/2021 26 0 - 40 IU/L Final         Passed - eGFR is 30 or above and within 360 days    GFR calc Af Amer  Date Value Ref Range Status  12/21/2018 100 >59 mL/min/1.73 Final   GFR calc non Af Amer  Date Value Ref Range Status  12/21/2018 86 >59 mL/min/1.73 Final   eGFR  Date Value Ref Range Status  08/16/2021 90 >59 mL/min/1.73 Final         Passed - Patient is not pregnant      Passed - Valid encounter within last 12 months    Recent Outpatient Visits          2 months ago OSA (obstructive sleep apnea)   Primary Care at [facility], MD   5 months ago OSA (obstructive sleep apnea)   Primary Care at [facility], Loraine Grip, PA-C   1 year ago Lumbar radiculopathy   Primary Care at [facility], MD   1 year ago Encounter for annual physical exam   Primary Care at [facility], [name], MD   1 year  ago Body mass index 50.0-59.9, adult [facility])   Primary Care at [facility], [name], MD      Future Appointments            In 6 days [name], MD Primary Care at [facility]

## 2022-02-03 ENCOUNTER — Encounter: Payer: Self-pay | Admitting: Family Medicine

## 2022-02-03 ENCOUNTER — Telehealth: Payer: Self-pay | Admitting: Internal Medicine

## 2022-02-03 ENCOUNTER — Ambulatory Visit: Payer: BC Managed Care – PPO | Admitting: Family Medicine

## 2022-02-03 VITALS — BP 138/92 | HR 93 | Temp 98.1°F | Resp 16 | Wt 382.2 lb

## 2022-02-03 DIAGNOSIS — Z6841 Body Mass Index (BMI) 40.0 and over, adult: Secondary | ICD-10-CM

## 2022-02-03 DIAGNOSIS — I10 Essential (primary) hypertension: Secondary | ICD-10-CM

## 2022-02-03 DIAGNOSIS — G4733 Obstructive sleep apnea (adult) (pediatric): Secondary | ICD-10-CM

## 2022-02-03 NOTE — Telephone Encounter (Signed)
Jacob Roberts,  This patient has never been seen by [place] before. Do we have a referral in place for him to be seen by [place]. I have to PCP office calling for me to call [employer] with updates on settings and usage.  Can you just let me know  Thank you

## 2022-02-03 NOTE — Telephone Encounter (Signed)
I spoke with Shay at Dr.

## 2022-02-04 NOTE — Telephone Encounter (Signed)
This is not a patient of ours.

## 2022-02-04 NOTE — Telephone Encounter (Signed)
Called and spoke to Homeland from the PCP office and advised her that this patient is not  patient of ours. She verbalized understanding and stated she would make a note in the chart and find out who he does see for his cpap machine. Nothing further needed

## 2022-02-05 ENCOUNTER — Encounter: Payer: Self-pay | Admitting: Family Medicine

## 2022-02-05 NOTE — Progress Notes (Signed)
Established Patient Office Visit  Subjective    Patient ID: Jacob Roberts, male    DOB: [birth year]  Age: 36 y.o. MRN: [id number]  CC:  Chief Complaint  Patient presents with   Follow-up    HPI Jacob Roberts presents for follow up of hypertension. Patient also reports that he has been reluctant to do an on site sleep study 2/2 $$. Patient denies acute complaints.    Outpatient Encounter Medications as of 02/03/2022  Medication Sig   amLODipine (NORVASC) 5 MG tablet TAKE 1 TABLET (5 MG TOTAL) BY MOUTH DAILY.   cyclobenzaprine (FLEXERIL) 10 MG tablet TAKE 1 TABLET BY MOUTH THREE TIMES A DAY AS NEEDED FOR MUSCLE SPASMS   ketoconazole (NIZORAL) 2 % cream Apply 1 Application topically daily.   meloxicam (MOBIC) 15 MG tablet TAKE 1 TABLET (15 MG TOTAL) BY MOUTH DAILY.   metFORMIN (GLUCOPHAGE-XR) 500 MG 24 hr tablet Take 1 tablet (500 mg total) by mouth daily with breakfast.   sildenafil (VIAGRA) 100 MG tablet Take 0.5-1 tablets (50-100 mg total) by mouth daily as needed for erectile dysfunction.   Vitamin D, Ergocalciferol, (DRISDOL) 1.25 MG (50000 UNIT) CAPS capsule Take 1 capsule (50,000 Units total) by mouth every 7 (seven) days.   No facility-administered encounter medications on file as of 02/03/2022.    Past Medical History:  Diagnosis Date   Essential hypertension    Heartburn    Low back pain    Morbid obesity (HCC)    Obstructive sleep apnea    Prediabetes     Past Surgical History:  Procedure Laterality Date   HERNIA REPAIR      Family History  Problem Relation Age of Onset   Hypertension Maternal Grandmother    Diabetes Maternal Grandmother    Hypertension Mother    Thyroid disease Mother    Obesity Mother    Hypertension Father    Obesity Father    Diabetes Brother    Obesity Brother    Diabetes Paternal Uncle    Stroke Neg Hx    Hyperlipidemia Neg Hx    Heart disease Neg Hx     Social History   Socioeconomic History   Marital status: Married     Spouse name: Not on file   Number of children: 2   Years of education: Not on file   Highest education level: Not on file  Occupational History   Not on file  Tobacco Use   Smoking status: Some Days    Types: Cigars   Smokeless tobacco: Never   Tobacco comments:    cigars ocasionaly  Vaping Use   Vaping Use: Never used  Substance and Sexual Activity   Alcohol use: Yes    Alcohol/week: 3.0 standard drinks of alcohol    Types: 3 Standard drinks or equivalent per week    Comment: occasional   Drug use: Never   Sexual activity: Yes    Partners: Female  Other Topics Concern   Not on file  Social History Narrative   Not on file   Social Determinants of Health   Financial Resource Strain: Not on file  Food Insecurity: Not on file  Transportation Needs: Not on file  Physical Activity: Not on file  Stress: Not on file  Social Connections: Not on file  Intimate Partner Violence: Not on file    Review of Systems  All other systems reviewed and are negative.       Objective    BP (!) 138/92  Pulse 93   Temp 98.1 F (36.7 C) (Oral)   Resp 16   Wt (!) 382 lb 3.2 oz (173.4 kg)   SpO2 98%   BMI 54.84 kg/m   Physical Exam Vitals and nursing note reviewed.  Constitutional:      General: He is not in acute distress.    Appearance: He is obese.  Cardiovascular:     Rate and Rhythm: Normal rate and regular rhythm.  Pulmonary:     Effort: Pulmonary effort is normal.     Breath sounds: Normal breath sounds.  Abdominal:     Palpations: Abdomen is soft.     Tenderness: There is no abdominal tenderness.  Neurological:     General: No focal deficit present.     Mental Status: He is alert and oriented to person, place, and time.         Assessment & Plan:   1. OSA (obstructive sleep apnea) Recommendation for patient to complete onsite study as recommended by consultant  2. Essential hypertension Slightly elevated reading. Discussed compliance. Continue  and monitor  3. Class 3 severe obesity due to excess calories with serious comorbidity and body mass index (BMI) of 50.0 to 59.9 in adult [facility]) Discussed dietary and activity options. Goal is 4-6lbs/mo wt loss.     No follow-ups on file.   Becky Sax, MD

## 2022-03-05 ENCOUNTER — Other Ambulatory Visit: Payer: Self-pay | Admitting: Family Medicine

## 2022-03-05 DIAGNOSIS — N529 Male erectile dysfunction, unspecified: Secondary | ICD-10-CM

## 2022-03-06 NOTE — Telephone Encounter (Signed)
Requested Prescriptions  Pending Prescriptions Disp Refills   sildenafil (VIAGRA) 100 MG tablet [Pharmacy Med Name: SILDENAFIL 100 MG TABLET] 5 tablet 2    Sig: TAKE 0.5-1 TABLETS BY MOUTH DAILY AS NEEDED FOR ERECTILE DYSFUNCTION.     Urology: Erectile Dysfunction Agents Failed - 03/05/2022  7:37 PM      Failed - ALT in normal range and within 360 days    ALT  Date Value Ref Range Status  12/21/2018 31 0 - 44 IU/L Final         Failed - Last BP in normal range    BP Readings from Last 1 Encounters:  02/03/22 (!) 138/92         Passed - AST in normal range and within 360 days    AST  Date Value Ref Range Status  08/16/2021 26 0 - 40 IU/L Final         Passed - Valid encounter within last 12 months    Recent Outpatient Visits           1 month ago OSA (obstructive sleep apnea)   Primary Care at [facility], Lauris Poag, MD   4 months ago OSA (obstructive sleep apnea)   Primary Care at [facility], MD   6 months ago OSA (obstructive sleep apnea)   Primary Care at [facility], Kasandra Knudsen, PA-C   1 year ago Lumbar radiculopathy   Primary Care at [facility], MD   1 year ago Encounter for annual physical exam   Primary Care at [facility], Kandee Keen, MD

## 2022-03-31 ENCOUNTER — Other Ambulatory Visit: Payer: Self-pay | Admitting: Family Medicine

## 2022-03-31 DIAGNOSIS — M5416 Radiculopathy, lumbar region: Secondary | ICD-10-CM

## 2022-06-03 ENCOUNTER — Other Ambulatory Visit: Payer: Self-pay | Admitting: Family Medicine

## 2022-06-03 DIAGNOSIS — I10 Essential (primary) hypertension: Secondary | ICD-10-CM

## 2022-06-29 ENCOUNTER — Other Ambulatory Visit: Payer: Self-pay | Admitting: Family Medicine

## 2022-06-29 DIAGNOSIS — M5416 Radiculopathy, lumbar region: Secondary | ICD-10-CM

## 2022-07-20 ENCOUNTER — Other Ambulatory Visit: Payer: Self-pay | Admitting: Family Medicine

## 2022-07-20 DIAGNOSIS — M5416 Radiculopathy, lumbar region: Secondary | ICD-10-CM

## 2022-08-20 ENCOUNTER — Other Ambulatory Visit: Payer: Self-pay | Admitting: Family Medicine

## 2022-08-20 DIAGNOSIS — M5416 Radiculopathy, lumbar region: Secondary | ICD-10-CM

## 2022-08-20 NOTE — Telephone Encounter (Signed)
Requested medication (s) are due for refill today: yes  Requested medication (s) are on the active medication list: yes  Last refill:  07/20/22  Future visit scheduled: no  Notes to clinic:  Unable to refill per protocol due to failed labs, no updated results.      Requested Prescriptions  Pending Prescriptions Disp Refills   meloxicam (MOBIC) 15 MG tablet [Pharmacy Med Name: MELOXICAM 15 MG TABLET] 30 tablet 0    Sig: TAKE 1 TABLET (15 MG TOTAL) BY MOUTH DAILY.     Analgesics:  COX2 Inhibitors Failed - 08/20/2022 12:41 AM      Failed - Manual Review: Labs are only required if the patient has taken medication for more than 8 weeks.      Failed - HGB in normal range and within 360 days    Hemoglobin  Date Value Ref Range Status  08/16/2021 16.0 13.0 - 17.7 g/dL Final         Failed - Cr in normal range and within 360 days    Creatinine, Ser  Date Value Ref Range Status  08/16/2021 1.09 0.76 - 1.27 mg/dL Final         Failed - HCT in normal range and within 360 days    Hematocrit  Date Value Ref Range Status  08/16/2021 46.8 37.5 - 51.0 % Final         Failed - AST in normal range and within 360 days    AST  Date Value Ref Range Status  08/16/2021 26 0 - 40 IU/L Final         Failed - ALT in normal range and within 360 days    ALT  Date Value Ref Range Status  12/21/2018 31 0 - 44 IU/L Final         Failed - eGFR is 30 or above and within 360 days    GFR calc Af Amer  Date Value Ref Range Status  12/21/2018 100 >59 mL/min/1.73 Final   GFR calc non Af Amer  Date Value Ref Range Status  12/21/2018 86 >59 mL/min/1.73 Final   eGFR  Date Value Ref Range Status  08/16/2021 90 >59 mL/min/1.73 Final         Passed - Patient is not pregnant      Passed - Valid encounter within last 12 months    Recent Outpatient Visits           6 months ago OSA (obstructive sleep apnea)   [place] [facility], [place]   9 months ago OSA  (obstructive sleep apnea)   [place] [facility], [place]   1 year ago OSA (obstructive sleep apnea)   [place] [facility], Kasandra Knudsen, PA-C   1 year ago Lumbar radiculopathy   [place] [facility], MD   2 years ago Encounter for annual physical exam   [facility], Kandee Keen, [place]

## 2022-09-03 ENCOUNTER — Other Ambulatory Visit: Payer: Self-pay | Admitting: Family Medicine

## 2022-09-03 DIAGNOSIS — I10 Essential (primary) hypertension: Secondary | ICD-10-CM

## 2022-09-17 ENCOUNTER — Other Ambulatory Visit: Payer: Self-pay | Admitting: Family Medicine

## 2022-09-17 DIAGNOSIS — M5416 Radiculopathy, lumbar region: Secondary | ICD-10-CM

## 2022-09-17 DIAGNOSIS — I10 Essential (primary) hypertension: Secondary | ICD-10-CM

## 2022-10-01 ENCOUNTER — Other Ambulatory Visit: Payer: Self-pay | Admitting: Family Medicine

## 2022-10-01 DIAGNOSIS — M5416 Radiculopathy, lumbar region: Secondary | ICD-10-CM

## 2022-10-26 ENCOUNTER — Other Ambulatory Visit: Payer: Self-pay | Admitting: Family Medicine

## 2022-10-26 DIAGNOSIS — I10 Essential (primary) hypertension: Secondary | ICD-10-CM

## 2022-10-31 ENCOUNTER — Other Ambulatory Visit: Payer: Self-pay | Admitting: Family Medicine

## 2022-10-31 DIAGNOSIS — M5416 Radiculopathy, lumbar region: Secondary | ICD-10-CM

## 2022-10-31 NOTE — Telephone Encounter (Signed)
Requested medications are due for refill today.  yes  Requested medications are on the active medications list.  yes  Last refill. 10/02/2022 #30 0 rf  Future visit scheduled.   no  Notes to clinic.  Expired labs.    Requested Prescriptions  Pending Prescriptions Disp Refills   meloxicam (MOBIC) 15 MG tablet [Pharmacy Med Name: MELOXICAM 15 MG TABLET] 30 tablet 0    Sig: TAKE 1 TABLET (15 MG TOTAL) BY MOUTH DAILY.     Analgesics:  COX2 Inhibitors Failed - 10/31/2022  1:45 AM      Failed - Manual Review: Labs are only required if the patient has taken medication for more than 8 weeks.      Failed - HGB in normal range and within 360 days    Hemoglobin  Date Value Ref Range Status  08/16/2021 16.0 13.0 - 17.7 g/dL Final         Failed - Cr in normal range and within 360 days    Creatinine, Ser  Date Value Ref Range Status  08/16/2021 1.09 0.76 - 1.27 mg/dL Final         Failed - HCT in normal range and within 360 days    Hematocrit  Date Value Ref Range Status  08/16/2021 46.8 37.5 - 51.0 % Final         Failed - AST in normal range and within 360 days    AST  Date Value Ref Range Status  08/16/2021 26 0 - 40 IU/L Final         Failed - ALT in normal range and within 360 days    ALT  Date Value Ref Range Status  12/21/2018 31 0 - 44 IU/L Final         Failed - eGFR is 30 or above and within 360 days    GFR calc Af Amer  Date Value Ref Range Status  12/21/2018 100 >59 mL/min/1.73 Final   GFR calc non Af Amer  Date Value Ref Range Status  12/21/2018 86 >59 mL/min/1.73 Final   eGFR  Date Value Ref Range Status  08/16/2021 90 >59 mL/min/1.73 Final         Passed - Patient is not pregnant      Passed - Valid encounter within last 12 months    Recent Outpatient Visits           9 months ago OSA (obstructive sleep apnea)   [place] [facility], MD   11 months ago OSA (obstructive sleep apnea)   [place] [facility], MD   1 year ago OSA (obstructive sleep apnea)   [place] [facility] at [facility], Kasandra Knudsen, PA-C   1 year ago Lumbar radiculopathy   [place] [facility], MD   2 years ago Encounter for annual physical exam   [facility], Kandee Keen, [place]

## 2022-11-14 ENCOUNTER — Other Ambulatory Visit: Payer: Self-pay | Admitting: Family Medicine

## 2022-11-14 DIAGNOSIS — M5416 Radiculopathy, lumbar region: Secondary | ICD-10-CM

## 2022-12-10 ENCOUNTER — Other Ambulatory Visit: Payer: Self-pay | Admitting: Family Medicine

## 2022-12-10 DIAGNOSIS — M5416 Radiculopathy, lumbar region: Secondary | ICD-10-CM

## 2022-12-26 ENCOUNTER — Other Ambulatory Visit: Payer: Self-pay | Admitting: Family Medicine

## 2022-12-26 DIAGNOSIS — M5416 Radiculopathy, lumbar region: Secondary | ICD-10-CM

## 2022-12-26 NOTE — Telephone Encounter (Signed)
Requested medication (s) are due for refill today - yes  Requested medication (s) are on the active medication list -yes  Future visit scheduled -no  Last refill: cyclobenzaprine- 09/19/22 #90 1RF- non delegated Rx                  Meloxicam- 11/20/22 #30- fails lab protocol- over 1 year-08/16/21  Notes to clinic: see above  Requested Prescriptions  Pending Prescriptions Disp Refills   cyclobenzaprine (FLEXERIL) 10 MG tablet 90 tablet 1     Not Delegated - Analgesics:  Muscle Relaxants Failed - 12/26/2022 11:36 AM      Failed - This refill cannot be delegated      Failed - Valid encounter within last 6 months    Recent Outpatient Visits           10 months ago OSA (obstructive sleep apnea)   [place] [facility], MD   1 year ago OSA (obstructive sleep apnea)   [place] [facility] at [facility], MD   1 year ago OSA (obstructive sleep apnea)   [place] [facility] at [facility], Kasandra Knudsen, [place]   2 years ago Lumbar radiculopathy   [place] [facility], MD   2 years ago Encounter for annual physical exam   [place] [facility], Kandee Keen, MD               meloxicam (MOBIC) 15 MG tablet 30 tablet 0    Sig: Take 1 tablet (15 mg total) by mouth daily.     Analgesics:  COX2 Inhibitors Failed - 12/26/2022 11:36 AM      Failed - Manual Review: Labs are only required if the patient has taken medication for more than 8 weeks.      Failed - HGB in normal range and within 360 days    Hemoglobin  Date Value Ref Range Status  08/16/2021 16.0 13.0 - 17.7 g/dL Final         Failed - Cr in normal range and within 360 days    Creatinine, Ser  Date Value Ref Range Status  08/16/2021 1.09 0.76 - 1.27 mg/dL Final         Failed - HCT in normal range and within 360 days    Hematocrit  Date Value Ref Range Status  08/16/2021 46.8 37.5 - 51.0  % Final         Failed - AST in normal range and within 360 days    AST  Date Value Ref Range Status  08/16/2021 26 0 - 40 IU/L Final         Failed - ALT in normal range and within 360 days    ALT  Date Value Ref Range Status  12/21/2018 31 0 - 44 IU/L Final         Failed - eGFR is 30 or above and within 360 days    GFR calc Af Amer  Date Value Ref Range Status  12/21/2018 100 >59 mL/min/1.73 Final   GFR calc non Af Amer  Date Value Ref Range Status  12/21/2018 86 >59 mL/min/1.73 Final   eGFR  Date Value Ref Range Status  08/16/2021 90 >59 mL/min/1.73 Final         Passed - Patient is not pregnant      Passed - Valid encounter within last 12 months  Recent Outpatient Visits           10 months ago OSA (obstructive sleep apnea)   [place] [facility], MD   1 year ago OSA (obstructive sleep apnea)   [place] [facility], MD   1 year ago OSA (obstructive sleep apnea)   [place] [facility], Kasandra Knudsen, [place]   2 years ago Lumbar radiculopathy   [place] [facility] at [facility], MD   2 years ago Encounter for annual physical exam   [place] [facility], Kandee Keen, [place]                 Requested Prescriptions  Pending Prescriptions Disp Refills   cyclobenzaprine (FLEXERIL) 10 MG tablet 90 tablet 1     Not Delegated - Analgesics:  Muscle Relaxants Failed - 12/26/2022 11:36 AM      Failed - This refill cannot be delegated      Failed - Valid encounter within last 6 months    Recent Outpatient Visits           10 months ago OSA (obstructive sleep apnea)   [place] [facility], MD   1 year ago OSA (obstructive sleep apnea)   [place] [facility] at [facility], MD   1 year ago OSA (obstructive sleep apnea)   [place] [facility] at  [facility], Kasandra Knudsen, [place]   2 years ago Lumbar radiculopathy   [place] [facility] at [facility], MD   2 years ago Encounter for annual physical exam    [facility] at [facility], Kandee Keen, [place]               meloxicam (MOBIC) 15 MG tablet 30 tablet 0    Sig: Take 1 tablet (15 mg total) by mouth daily.     Analgesics:  COX2 Inhibitors Failed - 12/26/2022 11:36 AM      Failed - Manual Review: Labs are only required if the patient has taken medication for more than 8 weeks.      Failed - HGB in normal range and within 360 days    Hemoglobin  Date Value Ref Range Status  08/16/2021 16.0 13.0 - 17.7 g/dL Final         Failed - Cr in normal range and within 360 days    Creatinine, Ser  Date Value Ref Range Status  08/16/2021 1.09 0.76 - 1.27 mg/dL Final         Failed - HCT in normal range and within 360 days    Hematocrit  Date Value Ref Range Status  08/16/2021 46.8 37.5 - 51.0 % Final         Failed - AST in normal range and within 360 days    AST  Date Value Ref Range Status  08/16/2021 26 0 - 40 IU/L Final         Failed - ALT in normal range and within 360 days    ALT  Date Value Ref Range Status  12/21/2018 31 0 - 44 IU/L Final         Failed - eGFR is 30 or above and within 360 days    GFR calc Af Amer  Date Value Ref Range Status  12/21/2018 100 >59 mL/min/1.73 Final  GFR calc non Af Amer  Date Value Ref Range Status  12/21/2018 86 >59 mL/min/1.73 Final   eGFR  Date Value Ref Range Status  08/16/2021 90 >59 mL/min/1.73 Final         Passed - Patient is not pregnant      Passed - Valid encounter within last 12 months    Recent Outpatient Visits           10 months ago OSA (obstructive sleep apnea)   [place] [facility], MD   1 year ago OSA (obstructive sleep apnea)   [place] [facility] at [facility], MD   1 year ago  OSA (obstructive sleep apnea)   [place] [facility] at [facility], Kasandra Knudsen, [place]   2 years ago Lumbar radiculopathy   [place] [facility], MD   2 years ago Encounter for annual physical exam   [facility] at [facility], Kandee Keen, [place]

## 2022-12-26 NOTE — Telephone Encounter (Signed)
Medication Refill - Medication:  cyclobenzaprine (FLEXERIL) 10 MG tablet   meloxicam (MOBIC) 15 MG tablet  Has the patient contacted their pharmacy? No. (Agent: If no, request that the patient contact the pharmacy for the refill. If patient does not wish to contact the pharmacy document the reason why and proceed with request.) (Agent: If yes, when and what did the pharmacy advise?)  Preferred Pharmacy (with phone number or street name): [id number] - [place], [place] - [address] AT [address]  Has the patient been seen for an appointment in the last year OR does the patient have an upcoming appointment? Yes.   (02/10/22) Pt advised he may need appt since it has been so long since we saw him.  Pt states it is just too far to come and he has not found a pcp where he lives.

## 2023-03-14 ENCOUNTER — Other Ambulatory Visit: Payer: Self-pay | Admitting: Family Medicine

## 2023-03-14 DIAGNOSIS — N529 Male erectile dysfunction, unspecified: Secondary | ICD-10-CM

## 2023-06-11 ENCOUNTER — Other Ambulatory Visit: Payer: Self-pay | Admitting: Family Medicine

## 2023-06-11 DIAGNOSIS — I10 Essential (primary) hypertension: Secondary | ICD-10-CM

## 2023-07-24 ENCOUNTER — Other Ambulatory Visit: Payer: Self-pay | Admitting: Family Medicine

## 2023-07-24 DIAGNOSIS — N529 Male erectile dysfunction, unspecified: Secondary | ICD-10-CM

## 2023-07-24 DIAGNOSIS — I10 Essential (primary) hypertension: Secondary | ICD-10-CM
